# Patient Record
Sex: Female | Born: 1970 | Race: White | Hispanic: No | Marital: Married | Smoking: Never smoker
Health system: Southern US, Community
[De-identification: ages and names within clinical notes are randomized; demographics above are authoritative.]

## PROBLEM LIST (undated history)

## (undated) DIAGNOSIS — G43019 Migraine without aura, intractable, without status migrainosus: Principal | ICD-10-CM

## (undated) DIAGNOSIS — R51 Headache: Secondary | ICD-10-CM

## (undated) DIAGNOSIS — F32A Depression, unspecified: Secondary | ICD-10-CM

## (undated) DIAGNOSIS — E669 Obesity, unspecified: Secondary | ICD-10-CM

## (undated) DIAGNOSIS — F419 Anxiety disorder, unspecified: Secondary | ICD-10-CM

## (undated) DIAGNOSIS — F329 Major depressive disorder, single episode, unspecified: Secondary | ICD-10-CM

## (undated) DIAGNOSIS — I499 Cardiac arrhythmia, unspecified: Secondary | ICD-10-CM

## (undated) DIAGNOSIS — R519 Headache, unspecified: Secondary | ICD-10-CM

## (undated) HISTORY — PX: NASAL SEPTUM SURGERY: SHX37

## (undated) HISTORY — DX: Anxiety disorder, unspecified: F41.9

## (undated) HISTORY — DX: Depression, unspecified: F32.A

## (undated) HISTORY — DX: Obesity, unspecified: E66.9

## (undated) HISTORY — PX: CARPAL TUNNEL RELEASE: SHX101

## (undated) HISTORY — DX: Major depressive disorder, single episode, unspecified: F32.9

## (undated) HISTORY — PX: BACK SURGERY: SHX140

## (undated) HISTORY — DX: Migraine without aura, intractable, without status migrainosus: G43.019

## (undated) HISTORY — PX: TONSILLECTOMY: SUR1361

---

## 1998-05-13 ENCOUNTER — Ambulatory Visit (HOSPITAL_COMMUNITY): Admission: RE | Admit: 1998-05-13 | Discharge: 1998-05-13 | Payer: Self-pay | Admitting: Family Medicine

## 1998-05-13 ENCOUNTER — Encounter: Payer: Self-pay | Admitting: Family Medicine

## 1998-05-20 ENCOUNTER — Ambulatory Visit (HOSPITAL_COMMUNITY): Admission: RE | Admit: 1998-05-20 | Discharge: 1998-05-20 | Payer: Self-pay | Admitting: Neurosurgery

## 1998-05-20 ENCOUNTER — Encounter: Payer: Self-pay | Admitting: Neurosurgery

## 1998-06-03 ENCOUNTER — Encounter: Payer: Self-pay | Admitting: Neurosurgery

## 1998-06-03 ENCOUNTER — Ambulatory Visit (HOSPITAL_COMMUNITY): Admission: RE | Admit: 1998-06-03 | Discharge: 1998-06-03 | Payer: Self-pay | Admitting: Neurosurgery

## 1998-06-17 ENCOUNTER — Ambulatory Visit (HOSPITAL_COMMUNITY): Admission: RE | Admit: 1998-06-17 | Discharge: 1998-06-17 | Payer: Self-pay | Admitting: Neurosurgery

## 1998-06-17 ENCOUNTER — Encounter: Payer: Self-pay | Admitting: Neurosurgery

## 1998-06-26 ENCOUNTER — Encounter: Payer: Self-pay | Admitting: Neurosurgery

## 1998-06-26 ENCOUNTER — Ambulatory Visit (HOSPITAL_COMMUNITY): Admission: RE | Admit: 1998-06-26 | Discharge: 1998-06-26 | Payer: Self-pay | Admitting: Neurosurgery

## 1998-06-30 ENCOUNTER — Encounter: Payer: Self-pay | Admitting: Neurosurgery

## 1998-06-30 ENCOUNTER — Inpatient Hospital Stay (HOSPITAL_COMMUNITY): Admission: AD | Admit: 1998-06-30 | Discharge: 1998-07-03 | Payer: Self-pay | Admitting: Neurosurgery

## 1998-07-01 ENCOUNTER — Encounter: Payer: Self-pay | Admitting: Neurosurgery

## 1998-07-16 ENCOUNTER — Other Ambulatory Visit: Admission: RE | Admit: 1998-07-16 | Discharge: 1998-07-16 | Payer: Self-pay | Admitting: Gynecology

## 1999-04-20 ENCOUNTER — Other Ambulatory Visit: Admission: RE | Admit: 1999-04-20 | Discharge: 1999-04-20 | Payer: Self-pay | Admitting: Gynecology

## 1999-05-01 ENCOUNTER — Encounter (INDEPENDENT_AMBULATORY_CARE_PROVIDER_SITE_OTHER): Payer: Self-pay

## 1999-05-01 ENCOUNTER — Ambulatory Visit (HOSPITAL_COMMUNITY): Admission: RE | Admit: 1999-05-01 | Discharge: 1999-05-01 | Payer: Self-pay | Admitting: Gynecology

## 1999-05-10 ENCOUNTER — Encounter (INDEPENDENT_AMBULATORY_CARE_PROVIDER_SITE_OTHER): Payer: Self-pay | Admitting: Specialist

## 1999-05-10 ENCOUNTER — Other Ambulatory Visit: Admission: RE | Admit: 1999-05-10 | Discharge: 1999-05-10 | Payer: Self-pay | Admitting: Gynecology

## 2000-06-21 ENCOUNTER — Encounter: Admission: RE | Admit: 2000-06-21 | Discharge: 2000-09-19 | Payer: Self-pay | Admitting: Gynecology

## 2000-09-01 ENCOUNTER — Other Ambulatory Visit: Admission: RE | Admit: 2000-09-01 | Discharge: 2000-09-01 | Payer: Self-pay | Admitting: Gynecology

## 2000-11-27 ENCOUNTER — Inpatient Hospital Stay (HOSPITAL_COMMUNITY): Admission: AD | Admit: 2000-11-27 | Discharge: 2000-11-27 | Payer: Self-pay | Admitting: Gynecology

## 2001-03-20 ENCOUNTER — Inpatient Hospital Stay (HOSPITAL_COMMUNITY): Admission: AD | Admit: 2001-03-20 | Discharge: 2001-03-22 | Payer: Self-pay | Admitting: *Deleted

## 2001-05-02 ENCOUNTER — Other Ambulatory Visit: Admission: RE | Admit: 2001-05-02 | Discharge: 2001-05-02 | Payer: Self-pay | Admitting: Gynecology

## 2002-11-14 ENCOUNTER — Encounter: Payer: Self-pay | Admitting: Family Medicine

## 2002-11-14 ENCOUNTER — Ambulatory Visit (HOSPITAL_COMMUNITY): Admission: RE | Admit: 2002-11-14 | Discharge: 2002-11-14 | Payer: Self-pay | Admitting: Family Medicine

## 2002-12-02 ENCOUNTER — Ambulatory Visit (HOSPITAL_COMMUNITY): Admission: RE | Admit: 2002-12-02 | Discharge: 2002-12-03 | Payer: Self-pay | Admitting: Neurosurgery

## 2003-12-12 ENCOUNTER — Ambulatory Visit (HOSPITAL_BASED_OUTPATIENT_CLINIC_OR_DEPARTMENT_OTHER): Admission: RE | Admit: 2003-12-12 | Discharge: 2003-12-12 | Payer: Self-pay | Admitting: Otolaryngology

## 2004-11-19 ENCOUNTER — Other Ambulatory Visit: Admission: RE | Admit: 2004-11-19 | Discharge: 2004-11-19 | Payer: Self-pay | Admitting: Gynecology

## 2005-07-04 ENCOUNTER — Encounter: Admission: RE | Admit: 2005-07-04 | Discharge: 2005-07-04 | Payer: Self-pay | Admitting: Neurosurgery

## 2005-08-09 ENCOUNTER — Encounter: Admission: RE | Admit: 2005-08-09 | Discharge: 2005-08-09 | Payer: Self-pay | Admitting: Neurosurgery

## 2005-09-09 ENCOUNTER — Encounter: Admission: RE | Admit: 2005-09-09 | Discharge: 2005-09-09 | Payer: Self-pay | Admitting: Neurosurgery

## 2005-11-23 ENCOUNTER — Inpatient Hospital Stay (HOSPITAL_COMMUNITY): Admission: RE | Admit: 2005-11-23 | Discharge: 2005-11-28 | Payer: Self-pay | Admitting: Neurosurgery

## 2007-03-21 HISTORY — PX: KNEE ARTHROSCOPY: SHX127

## 2007-03-25 ENCOUNTER — Encounter: Admission: RE | Admit: 2007-03-25 | Discharge: 2007-03-25 | Payer: Self-pay | Admitting: Neurosurgery

## 2007-10-25 ENCOUNTER — Other Ambulatory Visit: Admission: RE | Admit: 2007-10-25 | Discharge: 2007-10-25 | Payer: Self-pay | Admitting: Gynecology

## 2008-02-08 ENCOUNTER — Emergency Department (HOSPITAL_COMMUNITY): Admission: EM | Admit: 2008-02-08 | Discharge: 2008-02-08 | Payer: Self-pay | Admitting: Emergency Medicine

## 2008-02-14 ENCOUNTER — Encounter: Admission: RE | Admit: 2008-02-14 | Discharge: 2008-02-14 | Payer: Self-pay | Admitting: Geriatric Medicine

## 2008-03-05 ENCOUNTER — Ambulatory Visit (HOSPITAL_COMMUNITY): Admission: RE | Admit: 2008-03-05 | Discharge: 2008-03-05 | Payer: Self-pay | Admitting: General Surgery

## 2008-04-01 ENCOUNTER — Encounter: Admission: RE | Admit: 2008-04-01 | Discharge: 2008-04-01 | Payer: Self-pay | Admitting: General Surgery

## 2008-05-12 ENCOUNTER — Encounter: Admission: RE | Admit: 2008-05-12 | Discharge: 2008-08-10 | Payer: Self-pay | Admitting: General Surgery

## 2008-05-27 ENCOUNTER — Ambulatory Visit (HOSPITAL_COMMUNITY): Admission: RE | Admit: 2008-05-27 | Discharge: 2008-05-27 | Payer: Self-pay | Admitting: General Surgery

## 2008-05-27 HISTORY — PX: LAPAROSCOPIC GASTRIC BANDING: SHX1100

## 2008-06-11 ENCOUNTER — Ambulatory Visit: Payer: Self-pay | Admitting: Gynecology

## 2008-06-30 ENCOUNTER — Ambulatory Visit: Payer: Self-pay | Admitting: Gynecology

## 2008-08-26 ENCOUNTER — Ambulatory Visit: Payer: Self-pay | Admitting: Gynecology

## 2008-11-18 ENCOUNTER — Ambulatory Visit: Payer: Self-pay | Admitting: Gynecology

## 2008-11-18 HISTORY — PX: VAGINAL HYSTERECTOMY: SUR661

## 2008-11-26 ENCOUNTER — Ambulatory Visit (HOSPITAL_COMMUNITY): Admission: RE | Admit: 2008-11-26 | Discharge: 2008-11-27 | Payer: Self-pay | Admitting: Gynecology

## 2008-11-26 ENCOUNTER — Encounter: Payer: Self-pay | Admitting: Gynecology

## 2008-11-26 ENCOUNTER — Ambulatory Visit: Payer: Self-pay | Admitting: Gynecology

## 2008-12-05 ENCOUNTER — Ambulatory Visit: Payer: Self-pay | Admitting: Gynecology

## 2008-12-19 ENCOUNTER — Ambulatory Visit: Payer: Self-pay | Admitting: Gynecology

## 2010-01-15 ENCOUNTER — Other Ambulatory Visit: Admission: RE | Admit: 2010-01-15 | Discharge: 2010-01-15 | Payer: Self-pay | Admitting: Gynecology

## 2010-01-15 ENCOUNTER — Ambulatory Visit: Payer: Self-pay | Admitting: Gynecology

## 2010-03-26 IMAGING — CT CT HEAD W/O CM
1 of 2 series · 13 of 30 positions shown, 17 images · non-contrast
Comparison: None

CLINICAL DATA: Hypertensive episode, visual changes, dizziness

CT HEAD WITHOUT CONTRAST
TECHNIQUE: Contiguous axial images were obtained from the base of
the skull through the vertex without contrast.

[Series 2: brain · axial · 0.47mm/px · z∈[+157,+281]mm · 13 of 28 slices shown, 17 images]
[im 2/28  brain]
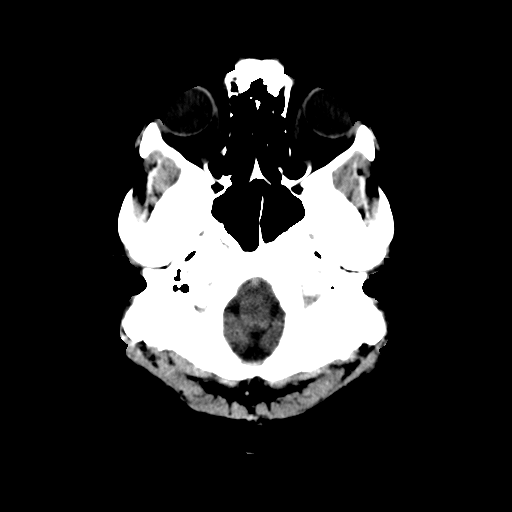
[im 2/28  bone]
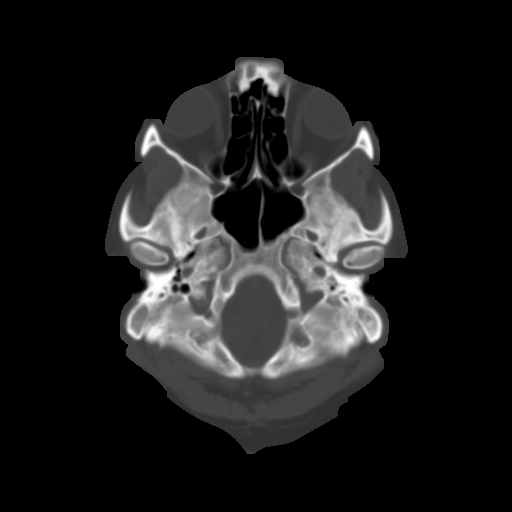
[im 4/28  brain]
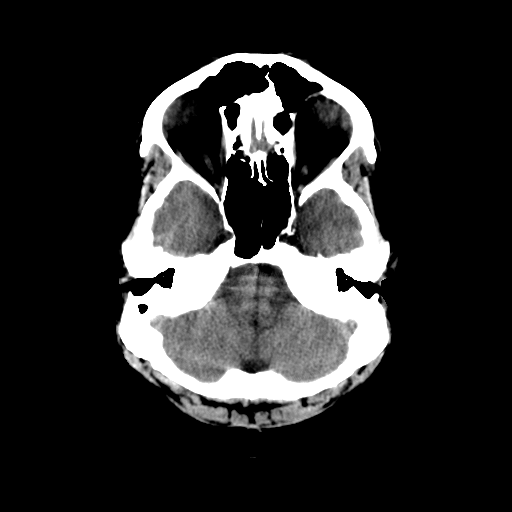
[im 6/28  brain]
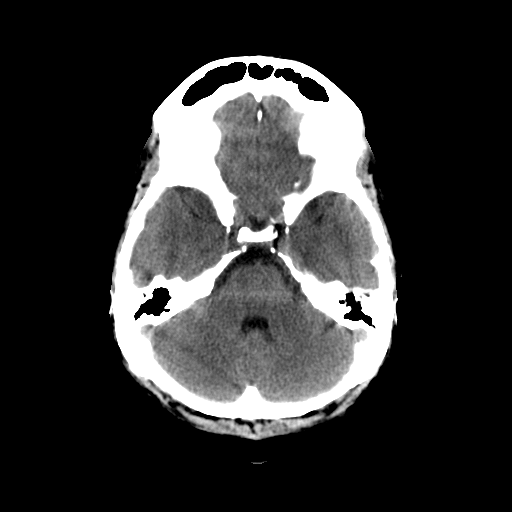
[im 8/28  brain]
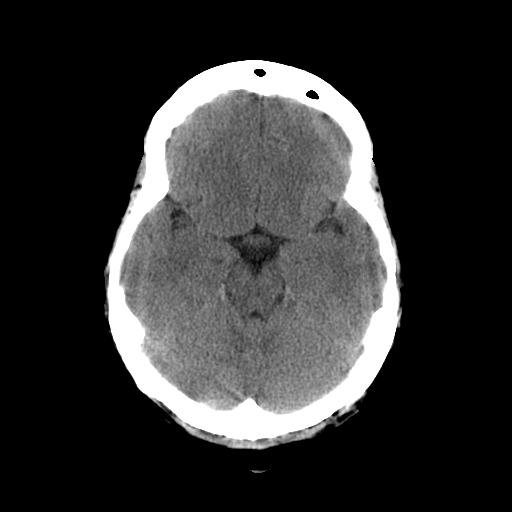
[im 10/28  brain]
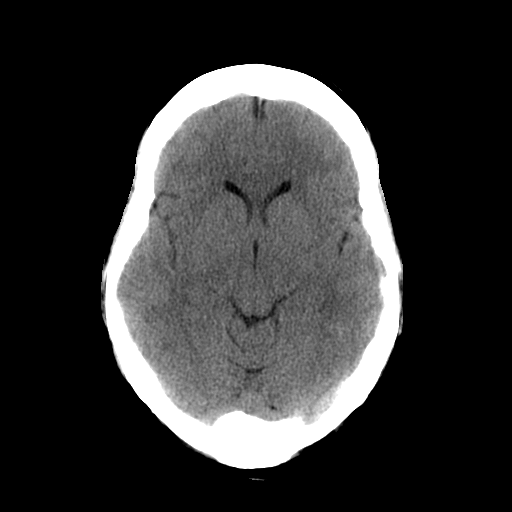
[im 10/28  bone]
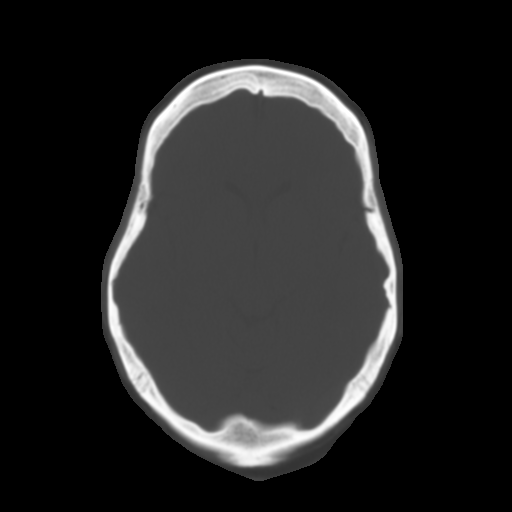
[im 12/28  brain]
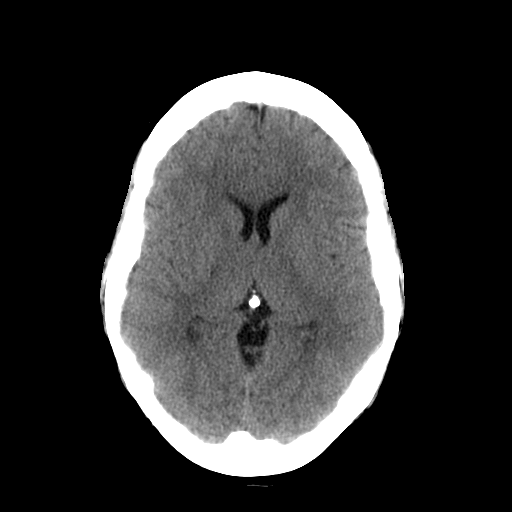
[im 14/28  brain]
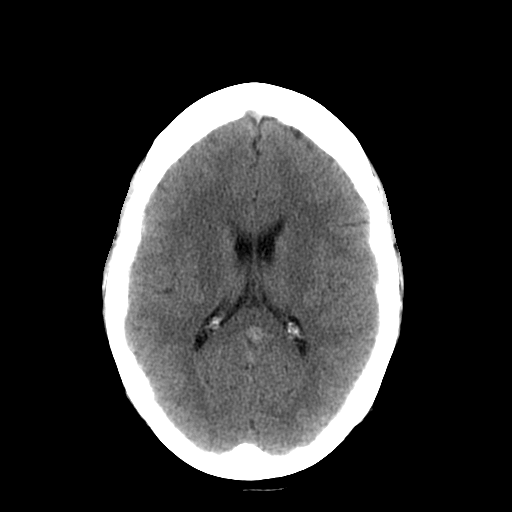
[im 16/28  brain]
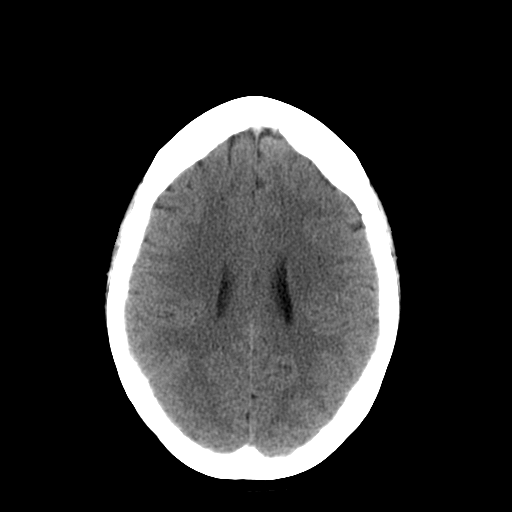
[im 18/28  brain]
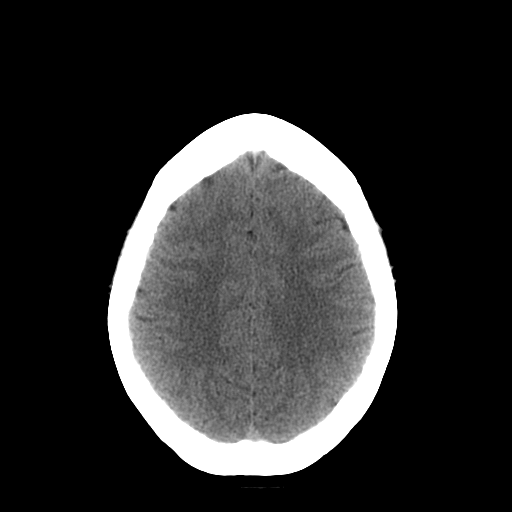
[im 18/28  bone]
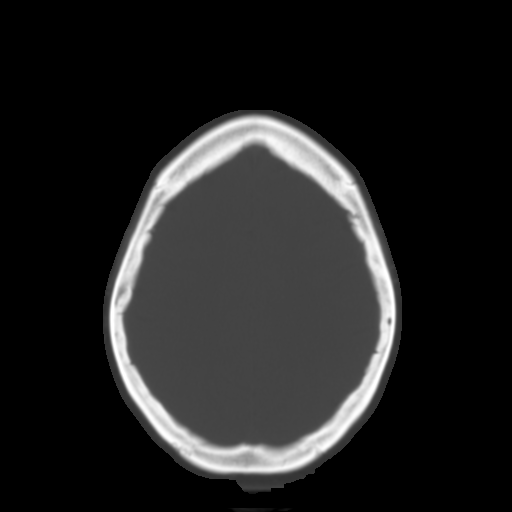
[im 20/28  brain]
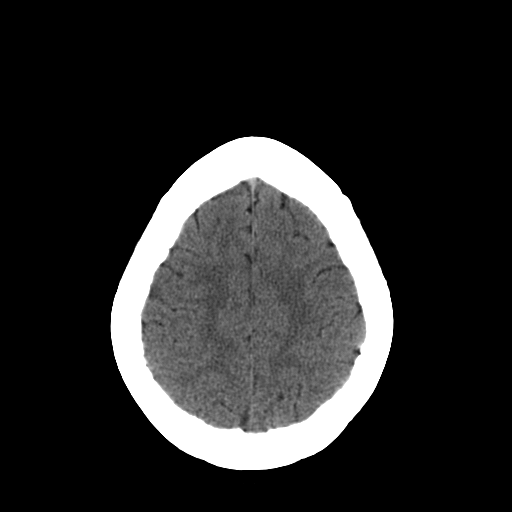
[im 22/28  brain]
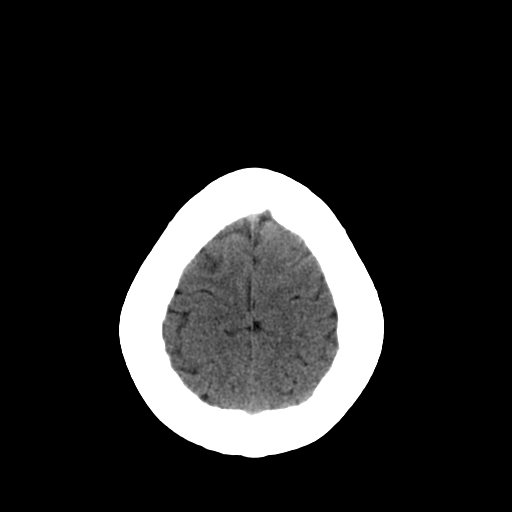
[im 24/28  brain]
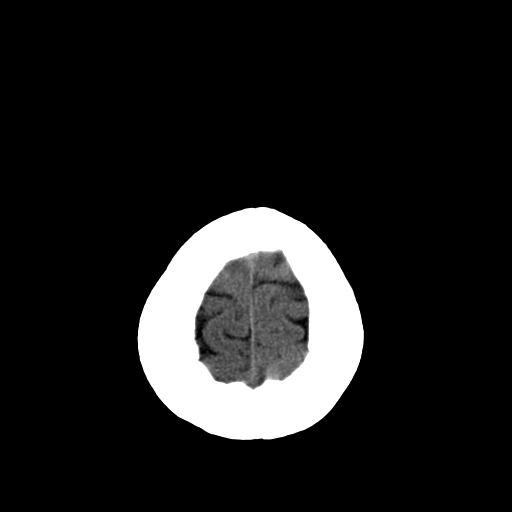
[im 26/28  brain]
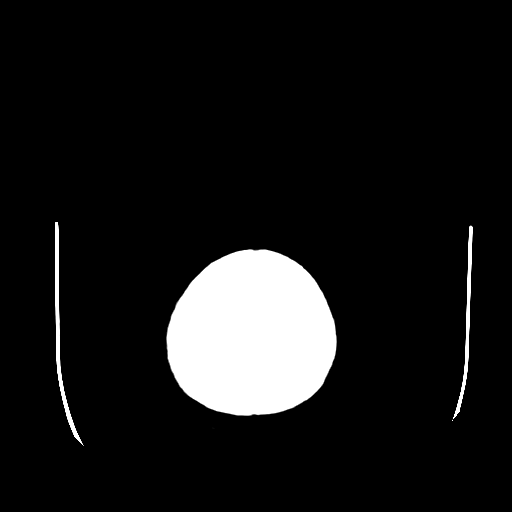
[im 26/28  bone]
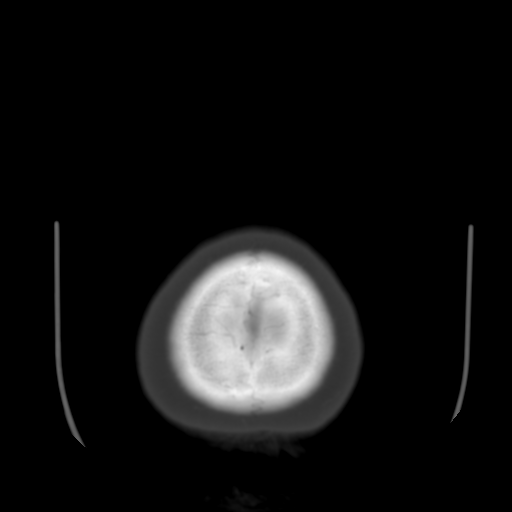

[13 of 30 positions shown; findings below may reference images not displayed]

FINDINGS: Normal ventricular morphology.
No midline shift or mass effect.
Normal appearance of brain parenchyma.
No intracranial hemorrhage, mass lesion, or acute infarct.
Visualized paranasal sinuses and mastoid air cells clear.
Bones unremarkable.
IMPRESSION: No acute intracranial abnormalities.

## 2010-07-11 ENCOUNTER — Encounter: Payer: Self-pay | Admitting: General Surgery

## 2010-08-02 ENCOUNTER — Ambulatory Visit (INDEPENDENT_AMBULATORY_CARE_PROVIDER_SITE_OTHER): Payer: BC Managed Care – PPO | Admitting: Gynecology

## 2010-08-02 DIAGNOSIS — R1031 Right lower quadrant pain: Secondary | ICD-10-CM

## 2010-08-04 ENCOUNTER — Other Ambulatory Visit: Payer: BC Managed Care – PPO

## 2010-08-04 ENCOUNTER — Ambulatory Visit: Payer: BC Managed Care – PPO | Admitting: Gynecology

## 2010-08-06 ENCOUNTER — Ambulatory Visit (INDEPENDENT_AMBULATORY_CARE_PROVIDER_SITE_OTHER): Payer: BC Managed Care – PPO | Admitting: Gynecology

## 2010-08-06 ENCOUNTER — Other Ambulatory Visit: Payer: BC Managed Care – PPO

## 2010-08-06 DIAGNOSIS — R1031 Right lower quadrant pain: Secondary | ICD-10-CM

## 2010-08-06 DIAGNOSIS — N83209 Unspecified ovarian cyst, unspecified side: Secondary | ICD-10-CM

## 2010-09-22 ENCOUNTER — Other Ambulatory Visit: Payer: BC Managed Care – PPO

## 2010-09-22 ENCOUNTER — Ambulatory Visit (INDEPENDENT_AMBULATORY_CARE_PROVIDER_SITE_OTHER): Payer: BC Managed Care – PPO | Admitting: Gynecology

## 2010-09-22 DIAGNOSIS — D3910 Neoplasm of uncertain behavior of unspecified ovary: Secondary | ICD-10-CM

## 2010-09-22 DIAGNOSIS — N949 Unspecified condition associated with female genital organs and menstrual cycle: Secondary | ICD-10-CM

## 2010-09-22 DIAGNOSIS — N831 Corpus luteum cyst of ovary, unspecified side: Secondary | ICD-10-CM

## 2010-09-27 LAB — CBC
HCT: 31.9 % — ABNORMAL LOW (ref 36.0–46.0)
HCT: 34 % — ABNORMAL LOW (ref 36.0–46.0)
Hemoglobin: 10.9 g/dL — ABNORMAL LOW (ref 12.0–15.0)
Hemoglobin: 11.5 g/dL — ABNORMAL LOW (ref 12.0–15.0)
MCHC: 33.9 g/dL (ref 30.0–36.0)
MCHC: 34.1 g/dL (ref 30.0–36.0)
MCV: 84.7 fL (ref 78.0–100.0)
MCV: 85.3 fL (ref 78.0–100.0)
Platelets: 294 10*3/uL (ref 150–400)
Platelets: 297 10*3/uL (ref 150–400)
RBC: 3.74 MIL/uL — ABNORMAL LOW (ref 3.87–5.11)
RBC: 4.02 MIL/uL (ref 3.87–5.11)
RDW: 14.4 % (ref 11.5–15.5)
RDW: 14.9 % (ref 11.5–15.5)
WBC: 11.9 10*3/uL — ABNORMAL HIGH (ref 4.0–10.5)
WBC: 6.5 10*3/uL (ref 4.0–10.5)

## 2010-09-27 LAB — HCG, SERUM, QUALITATIVE: Preg, Serum: NEGATIVE

## 2010-09-29 ENCOUNTER — Institutional Professional Consult (permissible substitution) (INDEPENDENT_AMBULATORY_CARE_PROVIDER_SITE_OTHER): Payer: BC Managed Care – PPO | Admitting: Gynecology

## 2010-09-29 DIAGNOSIS — N83209 Unspecified ovarian cyst, unspecified side: Secondary | ICD-10-CM

## 2010-11-02 NOTE — Discharge Summary (Signed)
NAMEPAULYNE, MOOTY             ACCOUNT NO.:  [id number]   MEDICAL RECORD NO.:  [id number]          PATIENT TYPE:  OIB   LOCATION:  9318                          FACILITY:  [facility]   PHYSICIAN:  Timothy P. Fontaine, M.D.DATE OF BIRTH:  September 16, 1970   DATE OF ADMISSION:  11/26/2008  DATE OF DISCHARGE:  11/27/2008                               DISCHARGE SUMMARY   DISCHARGE DIAGNOSES:  1. Leiomyoma.  2. Menorrhagia.  3. Dysmenorrhea.   PROCEDURE:  Laparoscopic-assisted vaginal hysterectomy, November 26, 2008.   PATHOLOGY:  Pending.   HOSPITAL COURSE:  A 40 year old underwent uncomplicated LAVH, November 26, 2008.  Postoperative course was uncomplicated.  She was discharged on  postoperative day #1, ambulating well, tolerating a regular diet with a  postoperative hemoglobin of 10.  The patient received precautions,  instructions, and followup, will be seen in the office in 1 week  following discharge and was given a prescription for Tylox, #25 1-2 p.o.  q.4-6 h. p.r.n. pain.      Timothy P. Fontaine, M.D.  Electronically Signed     TPF/MEDQ  D:  11/27/2008  T:  11/27/2008  Job:  [id number]

## 2010-11-02 NOTE — Op Note (Signed)
NAMENATACIA, CHAISSON             ACCOUNT NO.:  [id number]   MEDICAL RECORD NO.:  [id number]          PATIENT TYPE:  AMB   LOCATION:  DAY                          FACILITY:  [facility]   PHYSICIAN:  Sharlet Salina T. Hoxworth, M.D.DATE OF BIRTH:  [birth year]   DATE OF PROCEDURE:  05/27/2008  DATE OF DISCHARGE:                               OPERATIVE REPORT   PREOPERATIVE DIAGNOSIS:  Morbid obesity.   POSTOPERATIVE DIAGNOSIS:  Morbid obesity.   SURGICAL PROCEDURE:  Placement laparoscopic adjustable gastric band.   SURGEON:  Lorne Skeens. Hoxworth, M.D.   ASSISTANT:  Alfonse Ras, MD.   ANESTHESIA:  General.   BRIEF HISTORY:  Ms. Meroney is a 40 year old female with progressive  morbid obesity unresponsive to multiple attempts at medical management  who presents with a weight of 309 pounds, BMI of 46.3 and no  comorbidities.  After extensive discussion preoperatively detailed  elsewhere, we have elected proceed with placement laparoscopic  adjustable gastric band.  She is now brought to the operating room for  this procedure.   DESCRIPTION OF PROCEDURE:  The patient was brought to the operating room  and placed in the supine position on the operating room table and  general endotracheal anesthesia was induced.  PAS were in place.  She  had been given preoperative IV antibiotics and subcutaneous heparin.  The abdomen was widely sterilely prepped and draped.  The correct  patient and patient procedure were verified.  Local anesthesia was used  to infiltrate the trocar sites.  Access was obtained in the left  subcostal space with 11 mm OptiView trocar without difficulty and  pneumoperitoneum established.  There was no evidence of trocar injury.  Under direct vision a 15 mm trocar was placed bilaterally in the right  upper quadrant, an 11 mm trocar in the right subcostal space  midclavicular line, another 11 mm trocar above the left of the umbilicus  for camera port.  The patient was  placed in steep reverse Trendelenburg  and a Nathanson retractor was placed subxiphoid and the left lobe of the  liver elevated with excellent exposure of the hiatus and upper stomach.  The angle of His was exposed and the peritoneum incised here and blunt  dissection carried down along the left crus toward the retrogastric  space.  Following this, the pars flaccida was opened in the avascular  area and the crossing fat at the base of the right crus was identified.  The peritoneum was incised and the finger dissector easily passed into  this space retrogastric and deployed up to the previously dissected area  at the angle of His.  A flushed AP standard band system was then  introduced.  The tubing placed in the finger retractor and brought back  behind the stomach.  With the sizing tube in place, after pulling the  band behind the stomach it was locked into place and there was no undue  tension.  The sizing tube was removed, holding the tubing toward the  patient's feet.  The fundus was imbricated up over the band of the small  gastric pouch with three interrupted  2-0 Ethibond sutures.  In addition,  a gastro-gastric suture was placed just inferior to the band.  The band  appeared to be in excellent position.  There was no bleeding, evidence  of trocar injury or other abnormalities.  The tubing was brought out  through the right upper quadrant trocar site and the [facility] retractor  removed.  All CO2 evacuated.  Trocars removed.  This incision was  lengthened slightly and the anterior fascia exposed.  Four 2-0 Prolene  stay sutures were placed.  The tubing was cut and attached the port  which was then affixed to the anterior fascia with the previously placed  sutures.  The soft tissue was infiltrated with Marcaine.  The tubing was  seen to  curve smoothly into the abdomen.  This incision was irrigated.  The  subcutaneous was closed at the port site with running 2-0 Vicryl and all  skin  incisions were closed with subcuticular Monocryl and Dermabond.  Sponge, needle and instrument counts correct.  The patient was taken to  recovery in good condition.      Lorne Skeens. Hoxworth, M.D.  Electronically Signed     BTH/MEDQ  D:  05/27/2008  T:  05/27/2008  Job:  [id number]

## 2010-11-02 NOTE — Op Note (Signed)
[name], [name]             ACCOUNT NO.:  [id number]   MEDICAL RECORD NO.:  [id number]          PATIENT TYPE:  AMB   LOCATION:  SDC                           FACILITY:  [facility]   PHYSICIAN:  Timothy P. Fontaine, M.D.DATE OF BIRTH:  07-12-1970   DATE OF PROCEDURE:  11/26/2008  DATE OF DISCHARGE:                               OPERATIVE REPORT   PREOPERATIVE DIAGNOSES:  Leiomyoma, dysmenorrhea, menorrhagia.   POSTOPERATIVE DIAGNOSES:  Leiomyoma, dysmenorrhea, menorrhagia with  additional peritoneal cyst.   PROCEDURE:  Laparoscopic-assisted vaginal hysterectomy and excision of  peritoneal cyst.   SURGEON:  Timothy P. Fontaine, MD   ASSISTANT:  Rande Brunt. Eda Paschal, MD   ANESTHETIC:  General.   COMPLICATIONS:  None.   ESTIMATED BLOOD LOSS:  275 mL.   SPECIMEN:  1. Uterus.  2. Peritoneal cyst.   FINDINGS:  EUA, external BUS, and vagina normal.  Cervix normal.  Uterus, midline mobile, globoid, enlarged.  Adnexa without masses.  Uterus symmetrically enlarged consistent with history of myoma.  Anterior cul-de-sac and posterior cul-de-sac normal.  Right and left  fallopian tubes normal length, caliber, fimbriated ends.  Right and left  ovaries grossly normal, free, and mobile.  A small peritoneal cyst right  lateral posterior cul-de-sac, questionable infarcted epiploic versus  possibly small dermoid excised and sent to Pathology.  Upper abdominal  exam was grossly normal noting appendix free and mobile.  Liver smooth.  Gallbladder not visualized.  Lap-Band catheter noted in the upper  abdomen, left undisturbed.   PROCEDURE:  The patient was taken to the operating room, underwent  general anesthesia, was placed in low dorsal lithotomy position,  received an abdominal perineal vaginal preparation with Betadine  solution.  EUA performed.  Hulka tenaculum placed on the cervix, and the  bladder emptied with an indwelling Foley catheterization, placed in  sterile technique.  The  patient was draped in the usual fashion.  A  transverse infraumbilical incision was made, and using the 10-mm OptiVu  trocar the abdomen was directly entered under direct visualization  without difficulty and subsequently insufflated.  Right and left 5-mm  suprapubic ports were then placed under direct visualization after  transillumination of the vessels without difficulty.  Examination of the  pelvic organs and upper abdominal exam was carried out with findings  noted above.  The uterus was elevated from the pelvis, and using the  LigaSure cautery device, the left utero-ovarian pedicle was identified,  grasped, cauterized, and subsequently incised.  The broad ligament  peritoneal reflections were likewise incised, and the left round  ligament was cauterized and incised.  The left uterine vessels were  identified, again bipolar cauterized with the LigaSure device with 2  separate cauterizations and then incised.  The anterior vesicouterine  peritoneal fold was then sharply incised along the anterior surface.  A  similar procedure was then carried out on the other side meeting the  peritoneal reflection in the midline.  Attention was then turned to the  vaginal portion of the case, and the patient was placed in the high  dorsal lithotomy position.  Cervix visualized with a weighted speculum.  The Hulka tenaculum removed.  The cervix was regrasped with a tenaculum,  and the cervical mucosa was circumferentially injected using lidocaine  with epinephrine mixture, approximately 8 mL total.  The cervical mucosa  was then circumferentially and sharply incised, and through sharp  dissection the paracervical planes developed.  The posterior cul-de-sac  was then sharply entered without difficulty, and a long weighted  speculum was placed.  The right and left uterosacral ligaments were  identified, clamped, cut, and ligated using 0 Vicryl suture and tagged  for future reference.  The anterior  cul-de-sac was then sharply and  bluntly developed and ultimately entered, and the uterus was  progressively freed from its attachments through clamping, cutting, and  ligating of the cardinal ligaments and parametrial tissues using 0  Vicryl suture.  Due to the bulk of the uterus, it became necessary to  core initially the cervix and then ultimately to morcellate the  remaining uterus to allow the clots upon itself to be able to be removed  vaginally.  The uterus was ultimately removed, and all remaining  attachments were clamped, cut, and ligated using 0 Vicryl suture.  The  long weighted speculum was replaced for the shorter weighted speculum.  The posterior mucosa of the vagina was grasped with an Allis clamp, and  the posterior vaginal cuff was closed using a 0 Vicryl suture in a  running interlocking stitch from uterosacral ligament to uterosacral  ligament.  The tail sponge that was placed was removed, and the vagina  was closed anterior to posterior using 0 Vicryl suture in interrupted  figure-of-eight stitch.  Prior to this, the cul-de-sac was irrigated and  showed adequate hemostasis, and subsequently the vagina was irrigated  and again showed adequate hemostasis.  The weighted speculum was  removed.  The patient placed in low dorsal lithotomy position, the  abdomen reinsufflated, and reinspection of all pedicle sites showed  adequate hemostasis.  There are several small bleeders along the bladder  flap, and these were bipolar cauterized superficially without  difficulty.  She had the peritoneal cyst noted in the right cul-de-sac  and overtly looked like an infarcted epiploica, possibly a small  dermoid, and this was elevated, and the peritoneal adhesions were  sharply lysed to free of the cyst and it was removed and sent to  Pathology.  The gas was slowly allowed to escape.  Adequate hemostasis  visualized at the cul-de-sac and at the pedicle sites.  The 5-mm ports  were then  removed.  Hemostasis visualized at the port sites.  The  infraumbilical port was then backed out under direct visualization  showing adequate hemostasis and no evidence of hernia formation.  A 0  Vicryl interrupted subcutaneous fascial stitch was placed  infraumbilically, and all skin incisions closed using Dermabond skin  adhesive.  The patient was placed in supine position, awakened without  difficulty, and taken to recovery room in good condition having  tolerated the procedure well.      Timothy P. Fontaine, M.D.  Electronically Signed     TPF/MEDQ  D:  11/26/2008  T:  11/27/2008  Job:  [id number]

## 2010-11-02 NOTE — H&P (Signed)
NAMESHIRLEYANN, [name]             ACCOUNT NO.:  [id number]   MEDICAL RECORD NO.:  [id number]          PATIENT TYPE:  AMB   LOCATION:  SDC                           FACILITY:  [facility]   PHYSICIAN:  Timothy P. Fontaine, M.D.DATE OF BIRTH:  01/11/1971   DATE OF ADMISSION:  DATE OF DISCHARGE:                              HISTORY & PHYSICAL   [name] is being admitted to [facility] hospital November 26, 2008 at 8:30.   CHIEF COMPLAINT:  Menorrhagia, dysmenorrhea, and leiomyoma.   HISTORY OF PRESENT ILLNESS:  A 40 year old G11, P45, AB1  female,  vasectomy birth control with worsening menorrhagia, irregular bleeding,  dysmenorrhea, initially had ultrasound, which showed degenerating myoma  measuring 78 mm was followed expectantly.  Her pain and bleeding  continued and she is admitted for hysterectomy.  She had a  sonohystogram, which showed deviation of the cavity from the myoma, but  no intracavitary abnormalities, an endometrial biopsy showing benign  endocervical glands, but no endometrial tissue although appeared to be  clearly within the cavity and a thin endometrium was noted on the  sonohystogram.  Options for management were reviewed with her as noted  in the assessment and plan and she elects for hysterectomy.  She is  admitted for an LAVH.   PAST MEDICAL HISTORY:  Uncomplicated.   PAST SURGICAL HISTORY:  Includes a lap band surgery and lower disk  lumbar surgery with no lower spine restrictions on movement.   CURRENT MEDICATIONS:  No prescription medications.   ALLERGIES:  No medications.   REVIEW OF SYSTEMS:  Noncontributory.   FAMILY HISTORY:  Noncontributory.   SOCIAL HISTORY:  Noncontributory.   ADMISSION PHYSICAL EXAMINATION:  VITAL SIGNS:  Afebrile, vital signs are  stable.  HEENT: Normal.  LUNGS:  Clear.  CARDIAC:  Regular rate.  No rubs, murmurs, or gallops.  ABDOMEN:  Benign.  PELVIC:  External BUS, vagina normal.  Cervix normal.  Uterus globoid,  midline moveable.   Adnexa without masses or tenderness.   ASSESSMENT:  A 40 year old gravida 2, para 1, abortion 1 female  vasectomy birth control with worsening menorrhagia, dysmenorrhea, and  pelvic pain.  Ultrasound showing a degenerating fibroid, approximately 7  cm for laparoscopic-assisted vaginal hysterectomy.  The options for  management were reviewed with her to include observation, hormonal  manipulation such as low-dose oral contraceptives, attempted use of  Mirena IUD, progesterone only, Depo-Lupron suppression, uterine artery  embolization, myomectomy either open or robotic.  The patient ultimately  wants to proceed with hysterectomy.  I discussed various approaches and  to include TVH versus LAVH versus TAH and we agree on LAVH to free up  the uterus and allow for vaginal removal with hysterectomy was discussed  and long-term issues reviewed to include.  The patient understands it is  absolute irreversible sterility.  Sexuality following hysterectomy  discussed and the potential for persistent dyspareunia as well as  orgasmic dysfunction was all discussed, understood and accepted.  The  ovarian conservation issue was reviewed with her, options for keeping  both ovaries for continued hormone production, but the risk of ovarian  disease in the future  both benign and malignant and risk of ovarian  cancer was discussed versus removing her ovaries and the issues of  hypoestrogenism with acceleration of cardiovascular disease,  osteoporosis, symptoms the possibility for ERT and the risks of ERT to  include breast cancer, stroke, heart attack, DVT, WHI study was all  discussed, understood and accepted.  She wants to keep both ovaries,  accepting the risk of ovarian cancer and benign ovarian disease in the  future, but she does give me permission to remove one or both ovaries if  significant disease is encountered at the time of surgery.  I discussed  that technically it is involved with the surgery  to include use of the  laparoscopic multiple port sites, insufflation, use of technologies such  as electrocautery, Harmonic scalpel and the vaginal approach.  She also  understands that at any time during the procedure I may convert her LAVH  to a TAH if complications arise or if it is felt unsafe to proceed with  LAVH approach.  The risks of the surgery to include infection, requiring  prolonged antibiotics as well as abscess, hematoma formation requiring  reoperation and drainage was reviewed.  The risk of incisions,  complications to include opening and draining of incisions, closure by  secondary intention, longer incision such as a TAH incision, issues of  cosmetics to include keloid formation as well as long-term issues of  hernia formation was all discussed, understood, and accepted.  The risk  of bleeding leading to hemorrhage and transfusion and risks of  transfusion reviewed to include transfusion reaction, hepatitis, HIV,  mad cow disease and other unknown entities.  The risk of inadvertent  injury to internal organs including bowel, bladder, ureters, vessels and  nerves necessitating major exploratory reparative surgeries, future  reparative surgeries including bowel resection, bowel repair, ostomy  formation, bladder repair, ureteral reimplantation or damage repair was  all discussed, understood and accepted.  The patient's questions were  answered to her satisfaction.  She is ready to proceed with surgery.      Timothy P. Fontaine, M.D.  Electronically Signed     TPF/MEDQ  D:  11/18/2008  T:  11/19/2008  Job:  [id number]

## 2010-11-05 NOTE — Op Note (Signed)
[name], [name]             ACCOUNT NO.:  [id number]   MEDICAL RECORD NO.:  [id number]          PATIENT TYPE:  INP   LOCATION:  3007                         FACILITY:  [facility]   PHYSICIAN:  Hilda Lias, M.D.   DATE OF BIRTH:  [birth year]   DATE OF PROCEDURE:  11/23/2005  DATE OF DISCHARGE:                                 OPERATIVE REPORT   PREOPERATIVE DIAGNOSIS:  Degenerative disc disease, with recurrent herniated  disc L5-S1 x3, with a chronic S1 radiculopathy.   POSTOPERATIVE DIAGNOSIS:  Degenerative disc disease, with recurrent  herniated disc L5-S1 x3, with a chronic S1 radiculopathy.   PROCEDURE:  1.  Bilateral L5-S1 diskectomy, interbody fusion with a cage and BMP,      posterolateral arthrodesis L5-S1, pedicle screws L5-S1.  2.  Cell Saver.  3.  C-arm.   SURGEON:  Hilda Lias, M.D.   ASSISTANT:  Stefani Dama, M.D.   CLINICAL HISTORY:  The patient was admitted because of back pain with  radiation to the left leg.  The patient previously had two lumbar  diskectomies.  Now, she has degenerative changes.  Surgery was advised, and  the risks were explained in the history and physical.   PROCEDURE:  The patient was taken to the operating room, and she was  positioned in a prone manner.  The back was prepped with DuraPrep.  A  midline incision resecting the previous scar was made through the skin and  through at least 4 inches of fat tissue.  We identified the L5-S1 space, and  muscles were retracted laterally.  We did an x-ray with the C-arm to  localize the area.  From then on, we removed the spinous process and lamina  and facet of L5.  On the right side, we were able to retract the nerve root  without any problem, and diskectomy was accomplished.  On the left side, the  patient had quite a bit of scar tissue.  Lysis was accomplished to  decompress the L5 and S1 nerve root.  We entered the disc space.  Also,  interbody fusion was achieved.  Then, using two  cages of 22 x 10, BMP and  autograft were inserted in the disc space.  The lateral aspect of the disc  was also fused using BMP and autograft.  From then on, we started using the  C-arm.  Because of her obesity, it was quite difficult to see the bone  structure.  Nevertheless, using AP and lateral, we were able to localize the  pedicle of L5 and S1.  Pedicle probes were inserted, and four screws, two of  them 6.5 x 40 at the level of L5, and 6.5 x 45 at the level of S1 were  inserted.  With the nerve hook, we investigated, and there was no evidence  of any penetration of the medial aspect of the pedicle.  Nevertheless, the  images were careful in the AP and in the lateral view.  Then, after we had a  good fusion, the pedicle screws were connected with a rod and caps.  Then,  we went laterally.  We removed the periosteum of L5 and  the ala of the sacrum, and a mix of BMP and autograft was used for  arthrodesis.  From then on, the area was irrigated.  Valsalva maneuver was  negative.  Fentanyl was left in the pleural space, and the wound was closed  with Vicryl and Steri-Strips.           ______________________________  Hilda Lias, M.D.     EB/MEDQ  D:  11/23/2005  T:  11/23/2005  Job:  [id number]

## 2010-11-05 NOTE — Discharge Summary (Signed)
[name], [name]             ACCOUNT NO.:  [id number]   MEDICAL RECORD NO.:  [id number]          PATIENT TYPE:  INP   LOCATION:  3007                         FACILITY:  [facility]   PHYSICIAN:  Hilda Lias, M.D.   DATE OF BIRTH:  1971/04/02   DATE OF ADMISSION:  11/23/2005  DATE OF DISCHARGE:  11/28/2005                                 DISCHARGE SUMMARY   ADMISSION DIAGNOSIS:  Degenerative disc disease with recurrent herniated  disc L5-S1.   FINAL DIAGNOSIS:  Degenerative disc disease with recurrent herniated disc L5-  S1.   CLINICAL HISTORY:  The patient was admitted because of back pain with  radiation to both legs.  Patient previously has two lumbar discectomies.  The x-ray showed degenerative disc disease at the level of 5-1.  Fusion was  advised.   LABORATORY DATA:  Normal.   HOSPITAL COURSE:  The patient was taken to surgery and total L5-S1  discectomy followed by fusion using the pedicle screws was done.  Today she  is feeling much better.  She is ambulating.  She wants to go  home.   CONDITION ON DISCHARGE:  Improvement.   MEDICATIONS:  Percocet, Flexeril and Neurontin.   DIET:  She was encouraged to lose weight.   ACTIVITY:  Not to drive for at least a week.  No bending.   FOLLOW UP:  To be seen by me in five weeks.           ______________________________  Hilda Lias, M.D.     EB/MEDQ  D:  11/28/2005  T:  11/28/2005  Job:  [id number]

## 2010-11-05 NOTE — Op Note (Signed)
NAME:  [name], [name]                       ACCOUNT NO.:  [id number]   MEDICAL RECORD NO.:  [id number]                   PATIENT TYPE:  AMB   LOCATION:  DSC                                  FACILITY:  [facility]   PHYSICIAN:  Kinnie Scales. Annalee Genta, M.D.            DATE OF BIRTH:  [birth year]   DATE OF PROCEDURE:  12/12/2003  DATE OF DISCHARGE:                                 OPERATIVE REPORT   PREOPERATIVE DIAGNOSES AND INDICATION FOR SURGERY:  1. Deviated nasal septum.  2. Bilateral inferior turbinate hypertrophy.   POSTOPERATIVE DIAGNOSES AND INDICATION FOR SURGERY:  1. Deviated nasal septum.  2. Bilateral inferior turbinate hypertrophy.   SURGICAL PROCEDURE:  1. Nasal septoplasty.  2. Bilateral inferior turbinate intramural cautery.   ANESTHESIA:  General endotracheal.   SURGEON:  Dr. Annalee Genta   COMPLICATIONS:  None.  The patient was transferred from the operating room  to the recovery room in stable condition.   ESTIMATED BLOOD LOSS:  Less than 50 mL.   BRIEF HISTORY:  Ms. [name] is a 40 year old white female, who is referred  for evaluation of chronic sinus complaints of headache, nasal pressure and  congestion.  She had been treated with multiple courses of antibiotics and  medical therapy, and sinus infections had resolved.  She was left with heavy  nasal congestion and deviated nasal septum with turbinate hypertrophy.  Given the patient's history, examination, findings, and failure to  completely respond to medical therapy, I recommend that we undertake nasal  septoplasty and turbinate reduction under general anesthesia.  The risks,  benefits, and possible complications of the procedures were discussed in  detail with the patient, who understood and concurred with our plan for  surgery which is scheduled as above.   SURGICAL PROCEDURE:  The patient is brought to the operating room on December 12, 2003, and placed in a supine position on the operating table.  General  endotracheal anesthesia is established without difficulty, and the patient  is adequately anesthetized.  Her nose is injected with 7 mL of 1% lidocaine  1:100,000 solution of epinephrine injected in submucosal fashion along the  patient's nasal septum and inferior turbinates.  The patient's nose is then  packed with Afrin-soaked cottonoid pledgets which were left in place for  approximately 5 minutes to allow for vasoconstriction and anesthesia.  The  pledgets were removed, and the patient's nasal cavity was examined.  Left  anterior hemi-transfixion incision was created.  This was carried through  the mucosa and underlying submucosa with a #15 scalpel blade.  A  mucoperichondrial flap was elevated from anterior to posterior on the  patient's left-hand side.  Bony cartilaginous junction was crossed at the  midline, and a mucoperiosteal flap was elevated on the patient's right-hand  side.  Deviated bone and cartilage from the mid and posterior aspects of the  nasal septum were then resected.  There was a large septal spur which was  resected after mobilizing the overlying mucosa.  The anterior dorsal and  __________ cartilage was not deviated, and this was left intact throughout  the procedure.  Resected mid septal cartilage was morselized and returned to  the mucoperichondrial pocket, and the mucoperichondrial flaps were  reapproximated with a 4-0 gut suture on a Keith needle in a horizontal  mattressing fashion.  Hemi-transfixion incision was closed with the same  stitch, and bilateral Doyle nasal septal splints were placed after the  application of Bactroban ointment on each side and sutured in position with  a 3-0 Ethilon suture.   Inferior turbinate reduction was then performed with the bipolar intramural  cautery set at 12 watts.  __________ were made in the submucosal fascia at  each inferior turbinate.  When the turbinates were adequately cauterized.  They were outfractured more  patent nasal cavity.  The patient's nasal cavity  and nasopharynx were then irrigated and suctioned.  Oral cavity was  suctioned.  Oral gastric tube was passed.  The patient was awakened from her  anesthetic.  She was extubated and was then transferred from the operating  room to the recovery room in stable condition.  There were no complications,  and blood loss was less than 50 mL.                                               Kinnie Scales. Annalee Genta, M.D.    DLS/MEDQ  D:  29/51/8841  T:  12/13/2003  Job:  [id number]

## 2010-11-05 NOTE — Discharge Summary (Signed)
[facility]  Patient:    [name] Visit Number: [id number] MRN: [id number]          Service Type: OBS Location: 910A 9110 01 Attending Physician:  Wetzel Bjornstad Dictated by:   Antony Contras, [facility] Admit Date:  03/20/2001 Discharge Date: 03/22/2001                             Discharge Summary  DISCHARGE DIAGNOSES:          1. Intrauterine pregnancy at term.                               2. Spontaneous onset of labor.  PROCEDURES:                   Low forceps assisted vaginal delivery over                               a midline episiotomy with fourth degree                               extension.  HISTORY OF PRESENT ILLNESS:   The patient is a 40 year old, gravida 2, para 0-0-1-0, with an EDC of March 28, 2001. Prenatal risk factors include a history of cryosurgery and D&C in 2000, and also positive ASCUS and CIN-1.  PRENATAL LABORATORY DATA:     Blood type A positive, antibody screen negative, RPR, HBsAg, HIV nonreactive, MSAFP normal, GBS is negative.  HOSPITAL COURSE/TREATMENT:    Patient was admitted on March 20, 2001 with spontaneous onset of labor. Cervix was 2 cm, 80%, and -2 station. She did progress to complete dilatation, was found to have an OP presentation. The patient was offered options of C-section delivery versus low forceps. Delivery was accomplished by low forceps delivery of a viable female infant weighing 8 pounds 8 ounces over midline episiotomy with fourth degree extension. Also, labial laceration was repaired separate from the midline.  Postpartum, the patient remained afebrile, had no difficulty voiding, was able to be discharged on her second postpartum day in satisfactory condition.  CBC:  Hematocrit 23.3, hemoglobin 7.9, WBCs 11.5, platelets 334,000.  DISPOSITION:                  The patient is to follow up in six weeks.  DISCHARGE MEDICATIONS:        The patient is to continued prenatal  vitamins and iron with Motrin and Tylox for pain. Dictated by:   Antony Contras, [facility] Attending Physician:  Wetzel Bjornstad DD:  04/06/01 TD:  04/08/01 Job: [id number]

## 2010-11-05 NOTE — H&P (Signed)
NAMELUJAIN, KRASZEWSKI                       ACCOUNT NO.:  [id number]   MEDICAL RECORD NO.:  [id number]                   PATIENT TYPE:  OIB   LOCATION:  2899                                 FACILITY:  [facility]   PHYSICIAN:  Hilda Lias, M.D.                DATE OF BIRTH:  [birth year]   DATE OF ADMISSION:  12/02/2002  DATE OF DISCHARGE:                                HISTORY & PHYSICAL   This is a 40 year old lady who four years ago underwent a left L5, S1  diskectomy.  The patient did very well.  I have not seen her since March  2000 and since then she got married, she had a baby, and for the past four  months she has been complaining of back pain which for the past six weeks is  getting worse, going down the left leg with a pin sensation, quite painful,  up to the point that __________  has not improved.  The patient feels that  this is almost the same as what she had before.  She had an MRI and because  of the findings, she wants to proceed with surgery.   PAST MEDICAL HISTORY:  L5, S1 diskectomy four years ago.  She is not  allergic to any medications.  She is 5' 10, 220 pounds.  The patient does  not smoke.  She drinks socially.   FAMILY HISTORY:  Negative.   REVIEW OF SYSTEMS:  Negative except for back and left leg pain.   PHYSICAL EXAMINATION:  The patient came to my office limping from the left  leg.  HEENT:  Normal.  LUNGS:  Clear.  HEART:  Sounds normal.  ABDOMEN:  Normal.  NEUROLOGIC:  Neck is supple.  Cranial nerves are normal.  Strength normal  except she had weakness of the plantar flexor of the left foot, 2-3/5.  Reflexes are present with absent of the left ankle jerk. Straight leg  raising (SLR) of the right side is positive at 60 degrees, left side 30  degrees.  The patient has a highly positive sciatic notch on the left side.   The MRI is quite impressive.  This lady has a large, recurrent disk at L5-S1  on the left side.   IMPRESSION:  Left L5-S1  severe disk, recurrent.   The patient to see the surgeon.  The procedure will be the same approach  using a microscope.  She knows all the risks such as infection, __________  , worsening of pain, paralysis, numbness, damage to the  vessel of the  abdomen, need for further surgery.                                                Hilda Lias, M.D.    EB/MEDQ  D:  12/02/2002  T:  12/02/2002  Job:  [id number]

## 2010-11-05 NOTE — Op Note (Signed)
NAME:  [name], [name]                       ACCOUNT NO.:  [id number]   MEDICAL RECORD NO.:  [id number]                   PATIENT TYPE:  OIB   LOCATION:  3172                                 FACILITY:  [facility]   PHYSICIAN:  Hilda Lias, M.D.                DATE OF BIRTH:  1971-03-09   DATE OF PROCEDURE:  12/02/2002  DATE OF DISCHARGE:                                 OPERATIVE REPORT   PREOPERATIVE DIAGNOSIS:  Recurrent left L5-S1 herniated disk.   POSTOPERATIVE DIAGNOSIS:  Recurrent left L5-S1 herniated disk.   OPERATION PERFORMED:  Left L5-S1 diskectomy, removal of large fragment,  lysis of adhesions, foraminotomy.  Microscope.   SURGEON:  Hilda Lias, M.D.   ASSISTANT:  Cristi Loron, M.D.   ANESTHESIA:  General.   INDICATIONS FOR PROCEDURE:  [name] is a 40 year old female who four  years ago underwent 4-5 and 5-1 diskectomy.  The patient did well but  lately, for the past few months, has been complaining of back pain with  radiation down to the left leg.  MRI shows a large recurrent disk.  The  patient knew of the risks, especially with her obesity.  She is 295 pounds.  She declined more opinions.   DESCRIPTION OF PROCEDURE:  The patient was taken to the operating room and  was positioned in a prone manner.  The back was prepped with Betadine.  The  previous scar was removed and we went through the skin through thick adipose  tissue until we were able to find the area where she had surgery before.  The muscle was retracted laterally.  We brought the microscope into the area  immediately because that was the only way to bring light into the area.  With the microcurets, we removed the previous scar tissue.  With the drill,  we drilled part of the upper S1.  We __________ 1 and 2 mm Kerrison punch  and going through the foramen and then lateral and superior we were able to  do lysis of adhesions.  The S1 nerve root was swollen and displaced  posterior and  medial.  Lysis was accomplished.  We were able to retract  slightly the S1 nerve root and then we entered into the disk space.  There  was a large fragment of disk.  From then on we entered the disk space and  total medial and lateral diskectomy was accomplished.  Foraminotomy was  done.  At the end, we had decompression.  Valsalva maneuver was negative.  The wound was closed with Vicryl and Steri-Strips.                                                Hilda Lias, M.D.    EB/MEDQ  D:  12/02/2002  T:  12/02/2002  Job:  [id number]

## 2010-11-05 NOTE — H&P (Signed)
NAMEBELLA, BRUMMET             ACCOUNT NO.:  [id number]   MEDICAL RECORD NO.:  [id number]          PATIENT TYPE:  INP   LOCATION:  3172                         FACILITY:  [facility]   PHYSICIAN:  Hilda Lias, M.D.   DATE OF BIRTH:  July 18, 1970   DATE OF ADMISSION:  11/23/2005  DATE OF DISCHARGE:                                HISTORY & PHYSICAL   HISTORY OF PRESENT ILLNESS:  Ms. [name] is a lady who was seen by me  initially at the beginning of this year complaining of back pain with  radiation down to the left leg for almost six months.  All of this started  when she found her dog dead and she lifted it and immediately developed  pain.  In the past, this lady underwent lumbar diskectomy at L5-S1, the last  one in June 2005, which is almost two years ago.  She started __________ and  she has failed conservative treatment.  She is getting worse.  She is  complaining of numbness sensation, lower back pain mostly going to the left  leg but also the right leg.  She was seen by me.  Conservative treatment was  given and because of no improvement surgery was advised.   PAST MEDICAL HISTORY:  Lumbar diskectomy x2.   ALLERGIES:  She is not allergic to any medications.   SOCIAL HISTORY:  She does not smoke.  She drinks socially.   FAMILY HISTORY:  Negative.   REVIEW OF SYSTEMS:  Positive for back and left leg pain.   PHYSICAL EXAMINATION:  GENERAL:  The patient presents to my office limping  on the left leg.  VITAL SIGNS:  She is 5 feet 10 inches, weight is 240 pounds.  HEENT:  Head is normal.  NECK:  Normal.  LUNGS:  Clear.  HEART: Sounds normal.  There were no murmurs.  EXTREMITIES:  1+ pulses.  NEUROLOGICAL:  She is completely normal except in the lumbar area.  She has  decreased reflex ability of the lumbar spine and she has positive sciatic  notch angle tenderness on the left side, negative on the right side.  She  has some weakness of dorsiflexion of the left foot.  The right  one is  completely normal.  Reflexes show absence of the left __________.   LABORATORY DATA:  Normal spine x-ray plus MRI reports herniated disk at the  L5-S1 with degenerative disk disease at this level with displacement of the  thecal sac.   CLINICAL IMPRESSION:  Degenerative disk disease with recurrent herniated  disk x3, L5-S1, left.   RECOMMENDATIONS:  The patient is being admitted for surgery.  The procedure  will be an L5-S1 diskectomy, interbody fusion and pedicle screws.  She knows  of the risks such as infection, CSF leak, worsening of the pain, paralysis,  and no improvement whatsoever.  The patient declines a second opinion.           ______________________________  Hilda Lias, M.D.     EB/MEDQ  D:  11/23/2005  T:  11/23/2005  Job:  [id number]

## 2010-11-05 NOTE — H&P (Signed)
[facility]  Patient:    [name], [name] Visit Number: [id number] MRN: [id number]          Service Type: OBS Location: 910B 9160 01 Attending Physician:  Wetzel Bjornstad Dictated by:   Katy Fitch, M.D. Admit Date:  03/20/2001                           History and Physical  CHIEF COMPLAINT:              Labor.  HISTORY OF PRESENT ILLNESS:   Patient is a 40 year old G2, P0 at 51 weeks, who comes into triage complaining of contractions every five minutes.  The patient states that she has been laboring for several hours but denies any vaginal bleeding, rupture of membranes, fevers, nausea, vomiting, headache, chills or change in bowel or bladder habits.  Patient has had an uneventful prenatal course.  PAST MEDICAL HISTORY:         None.  PAST SURGICAL HISTORY:        Cryosurgery and D&C in 2000.  PAST GYNECOLOGICAL HISTORY:   Positive ASCUS and CIN-1, surgery -- D&C.  MEDICATIONS:                  Prenatal vitamins.  ALLERGIES:                    None.  FAMILY HISTORY:               Without any mental retardation or epithelial cancers.  SOCIAL HISTORY:               Without any tobacco, alcohol or drugs.  PRENATAL LABORATORY DATA:     A-positive, rubella immune, GBS negative.  PHYSICAL EXAMINATION:  VITAL SIGNS:                  Blood pressure 122/70.  HEENT:                        Throat clear.  LUNGS:                        Clear to auscultation bilaterally.  HEART:                        Regular rate and rhythm.  ABDOMEN:                      Gravid and nontender.  PELVIC:                       Cervix 2, 80, -2.  PLAN:                         AROM with internal monitors.  Patient will get an epidural and is GBS negative so no need for penicillin.  Will place IUPC to see if necessitating Pitocin. Dictated by:   Katy Fitch, M.D. Attending Physician:  Wetzel Bjornstad DD:  03/20/01 TD:  03/20/01 Job:  [id number]

## 2010-12-30 ENCOUNTER — Ambulatory Visit (INDEPENDENT_AMBULATORY_CARE_PROVIDER_SITE_OTHER): Payer: BLUE CROSS/BLUE SHIELD | Admitting: General Surgery

## 2011-01-27 ENCOUNTER — Encounter (INDEPENDENT_AMBULATORY_CARE_PROVIDER_SITE_OTHER): Payer: Self-pay | Admitting: General Surgery

## 2011-01-28 ENCOUNTER — Encounter (INDEPENDENT_AMBULATORY_CARE_PROVIDER_SITE_OTHER): Payer: Self-pay | Admitting: General Surgery

## 2011-01-28 ENCOUNTER — Ambulatory Visit (INDEPENDENT_AMBULATORY_CARE_PROVIDER_SITE_OTHER): Payer: BLUE CROSS/BLUE SHIELD | Admitting: General Surgery

## 2011-01-28 NOTE — Patient Instructions (Signed)
Exercise 4 times per week for 40 minutes. Followup appointment with dietician. We will see her back in 3 months.

## 2011-01-28 NOTE — Progress Notes (Signed)
History: Patient returns for followup with a history of a LAP-BAND placement May 27, 2008. She was last seen here July 03, 2009. At that point she was having some weight regain and was felt to have inadequate restriction as well as some diet of choice issues. She had a half cc filter bring her to 6 cc. She states to eat initially she had very significant increase restriction and weight loss. She lost about 20 pounds but over the last year or so has gained this back plus a few more. On questioning she however continues to have symptoms of being quite adequately or even over restricted. She cannot tolerate solid food in the morning. She frequently cannot tolerate solid food at all even in the afternoon sore evenings. She will have regurgitation at least a couple times a week. She will eat some soft foods when this occurs. She has been through [employer] and has been unable to eat enough points for this. She has not been exercising. She states she tried this for a couple of weeks and noted weight regain.  Exam: Obese but otherwise healthy. Abdomen soft and nontender port site looks fine.  Assessment and plan: She has at least adequate restriction and I suspect is over restricted. I think she has some maladaptive eating. Also she is not exercising. I recommended taking out a small amount of fluid to allow her to eat greater variety of appropriate foods. We also discussed the importance of exercise and I stressed she needs to be exercising for 40 minutes 4 times per week.  I removed 0.25 cc from her band. We also will get her a followup appointment with the dietitian. With these interventions I think she can still have success. I will see her back in 3 months.

## 2011-03-15 ENCOUNTER — Inpatient Hospital Stay: Admission: RE | Admit: 2011-03-15 | Payer: BLUE CROSS/BLUE SHIELD | Source: Ambulatory Visit

## 2011-03-15 ENCOUNTER — Ambulatory Visit: Payer: BLUE CROSS/BLUE SHIELD | Admitting: Gynecology

## 2011-03-17 ENCOUNTER — Encounter: Payer: BLUE CROSS/BLUE SHIELD | Admitting: Gynecology

## 2011-03-21 ENCOUNTER — Ambulatory Visit (INDEPENDENT_AMBULATORY_CARE_PROVIDER_SITE_OTHER): Payer: BLUE CROSS/BLUE SHIELD | Admitting: Gynecology

## 2011-03-21 ENCOUNTER — Encounter: Payer: Self-pay | Admitting: Gynecology

## 2011-03-21 ENCOUNTER — Ambulatory Visit (INDEPENDENT_AMBULATORY_CARE_PROVIDER_SITE_OTHER): Payer: BLUE CROSS/BLUE SHIELD

## 2011-03-21 ENCOUNTER — Other Ambulatory Visit: Payer: Self-pay | Admitting: Gynecology

## 2011-03-21 DIAGNOSIS — R102 Pelvic and perineal pain: Secondary | ICD-10-CM

## 2011-03-21 DIAGNOSIS — N949 Unspecified condition associated with female genital organs and menstrual cycle: Secondary | ICD-10-CM

## 2011-03-21 DIAGNOSIS — D3910 Neoplasm of uncertain behavior of unspecified ovary: Secondary | ICD-10-CM

## 2011-03-21 DIAGNOSIS — N83209 Unspecified ovarian cyst, unspecified side: Secondary | ICD-10-CM

## 2011-03-21 NOTE — Progress Notes (Signed)
Patient follows up for ultrasound. She was seen in April with some physiologic changes in her ovary was concerned about ovarian cancer, that a CA 125 which returned 7.6. She's here for follow up ultrasounds of her pain comes and goes but does disappear seems oscillated between right and left side. Her ultrasound today shows physiologic changes both ovaries but no significant abnormalities. Her previously seen cyst on her right ovary has resolved she has a corpus luteum on her left now. We discussed this I think she is having physiologic discomfort from ovulation.  I reviewed the options to include expectant management, ovarian suppression with oral contraceptives or Depo-Lupron up to and including salpingo-oophorectomy. Patient is comfortable for observation.  She is due for her annual she will make an appointment sometime in the next month or so and then follow up with me at that time.

## 2011-03-25 LAB — COMPREHENSIVE METABOLIC PANEL
ALT: 25 U/L (ref 0–35)
AST: 23 U/L (ref 0–37)
Albumin: 3.8 g/dL (ref 3.5–5.2)
Alkaline Phosphatase: 83 U/L (ref 39–117)
BUN: 13 mg/dL (ref 6–23)
CO2: 27 mEq/L (ref 19–32)
Calcium: 9 mg/dL (ref 8.4–10.5)
Chloride: 106 mEq/L (ref 96–112)
Creatinine, Ser: 0.83 mg/dL (ref 0.4–1.2)
GFR calc Af Amer: >60 mL/min (ref >60)
GFR calc non Af Amer: >60 mL/min (ref >60)
Glucose, Bld: 101 mg/dL — ABNORMAL HIGH (ref 70–99)
Potassium: 3.7 mEq/L (ref 3.5–5.1)
Sodium: 136 mEq/L (ref 135–145)
Total Bilirubin: 0.9 mg/dL (ref 0.3–1.2)
Total Protein: 6.8 g/dL (ref 6.0–8.3)

## 2011-03-25 LAB — CBC
HCT: 40.3 % (ref 36.0–46.0)
Hemoglobin: 13.5 g/dL (ref 12.0–15.0)
MCHC: 33.4 g/dL (ref 30.0–36.0)
MCV: 87.8 fL (ref 78.0–100.0)
Platelets: 295 10*3/uL (ref 150–400)
RBC: 4.59 MIL/uL (ref 3.87–5.11)
RDW: 13.9 % (ref 11.5–15.5)
WBC: 5.9 10*3/uL (ref 4.0–10.5)

## 2011-03-25 LAB — DIFFERENTIAL
Basophils Absolute: 0 10*3/uL (ref 0.0–0.1)
Basophils Relative: 1 % (ref 0–1)
Eosinophils Absolute: 0.3 10*3/uL (ref 0.0–0.7)
Eosinophils Relative: 5 % (ref 0–5)
Lymphocytes Relative: 33 % (ref 12–46)
Lymphs Abs: 1.9 10*3/uL (ref 0.7–4.0)
Monocytes Absolute: 0.5 10*3/uL (ref 0.1–1.0)
Monocytes Relative: 9 % (ref 3–12)
Neutro Abs: 3.1 10*3/uL (ref 1.7–7.7)
Neutrophils Relative %: 53 % (ref 43–77)

## 2011-03-25 LAB — PREGNANCY, URINE: Preg Test, Ur: NEGATIVE

## 2011-06-06 ENCOUNTER — Telehealth: Payer: Self-pay | Admitting: *Deleted

## 2011-06-06 MED ORDER — FLUCONAZOLE 150 MG PO TABS: 150.0000 mg | ORAL_TABLET | Freq: Once | ORAL | Status: DC

## 2011-06-06 MED ORDER — FLUCONAZOLE 150 MG PO TABS: 150.0000 mg | ORAL_TABLET | Freq: Once | ORAL | Status: AC

## 2011-06-06 NOTE — Telephone Encounter (Addendum)
Pt informed with the below note. Called [facility] to cancel rx for diflucan 150 mg and sent to [facility] [place], pt wanted rx sent there.

## 2011-06-06 NOTE — Telephone Encounter (Signed)
Diflucan 150mg x 1 dose

## 2011-06-06 NOTE — Telephone Encounter (Signed)
Pt calling c/o yeast infection white discharge, itching only. Pt has annual scheduled for 06/15/11. She would like to something to help relieve itching if possible until her annual. Please advise.

## 2011-06-15 ENCOUNTER — Ambulatory Visit (INDEPENDENT_AMBULATORY_CARE_PROVIDER_SITE_OTHER): Payer: BC Managed Care – PPO | Admitting: Gynecology

## 2011-06-15 ENCOUNTER — Encounter: Payer: Self-pay | Admitting: Gynecology

## 2011-06-15 VITALS — BP 128/86 | Ht 68.25 in | Wt 297.0 lb

## 2011-06-15 DIAGNOSIS — Z13220 Encounter for screening for lipoid disorders: Secondary | ICD-10-CM

## 2011-06-15 DIAGNOSIS — Z01419 Encounter for gynecological examination (general) (routine) without abnormal findings: Secondary | ICD-10-CM

## 2011-06-15 DIAGNOSIS — M129 Arthropathy, unspecified: Secondary | ICD-10-CM

## 2011-06-15 DIAGNOSIS — Z131 Encounter for screening for diabetes mellitus: Secondary | ICD-10-CM

## 2011-06-15 DIAGNOSIS — M1990 Unspecified osteoarthritis, unspecified site: Secondary | ICD-10-CM

## 2011-06-15 NOTE — Patient Instructions (Signed)
Follow up annually for GYN checkup

## 2011-06-15 NOTE — Progress Notes (Signed)
[name] [birth year] [id number]        40 y.o.  for annual exam.  Doing well without complaints. She is having some issues with some left knee arthritis and her primary asked her to have a uric acid drawn to rule out gout. She is status post LAVH in the past for bleeding.  Past medical history,surgical history, medications, allergies, family history and social history were all reviewed and documented in the EPIC chart. ROS:  Was performed and pertinent positives and negatives are included in the history.  Exam: chaperone present Filed Vitals:   06/15/11 1222  BP: 128/86   General appearance  Normal Skin grossly normal Head/Neck normal with no cervical or supraclavicular adenopathy thyroid normal Lungs  clear Cardiac RR, without RMG Abdominal  soft, nontender, without masses, organomegaly or hernia Breasts  examined lying and sitting without masses, retractions, discharge or axillary adenopathy. Pelvic  Ext/BUS/vagina  normal   Adnexa  Without masses or tenderness    Anus and perineum  normal   Rectovaginal  normal sphincter tone without palpated masses or tenderness.    Assessment/Plan:  40 y.o. female for annual exam.    1. History of ovarian cyst. Patient has history of functional ovarian cysts causing discomfort. Most recent ultrasound was in October 2012 which was normal. She has occasional mittelschmerz type pain alternating side to side acceptable to her and she'll continue to monitor. As long as no persistent discomfort or other symptoms then we'll watch. 2. Pap smear. Patient has no history of abnormal Pap smears in the past. She has multiple normal records in her chart the last 2011. She is status post LAVH for benign indications. I discussed current screening guidelines and the options of stopping altogether versus going to less frequent screening interval was reviewed and we'll readdress this annually. No Pap was done today. 3. Breast health. SBE monthly reviewed.  Recommended screening mammogram this coming year she turns 40 and she agrees to arrange this. 4. Health maintenance. Baseline CBC lipid profile glucose urinalysis and uric acid was ordered. Assuming she continues well from a gynecologic standpoint she'll see [place] in a year sooner as needed.    Dara Lords MD, 1:24 PM 06/15/2011

## 2011-06-16 ENCOUNTER — Other Ambulatory Visit: Payer: Self-pay | Admitting: *Deleted

## 2011-06-16 DIAGNOSIS — E7800 Pure hypercholesterolemia, unspecified: Secondary | ICD-10-CM

## 2011-06-16 LAB — GLUCOSE, RANDOM: Glucose, Bld: 89 mg/dL (ref 70–99)

## 2011-06-16 LAB — LIPID PANEL
Cholesterol: 260 mg/dL — ABNORMAL HIGH (ref 0–200)
HDL: 55 mg/dL (ref >39)
LDL Cholesterol: 172 mg/dL — ABNORMAL HIGH (ref 0–99)
Total CHOL/HDL Ratio: 4.7 Ratio
Triglycerides: 165 mg/dL — ABNORMAL HIGH (ref <150)
VLDL: 33 mg/dL (ref 0–40)

## 2011-06-16 LAB — URIC ACID: Uric Acid, Serum: 4.9 mg/dL (ref 2.4–7.0)

## 2011-06-17 ENCOUNTER — Other Ambulatory Visit: Payer: BC Managed Care – PPO

## 2011-07-14 ENCOUNTER — Encounter (INDEPENDENT_AMBULATORY_CARE_PROVIDER_SITE_OTHER): Payer: Self-pay | Admitting: General Surgery

## 2011-08-22 ENCOUNTER — Encounter: Payer: Self-pay | Admitting: Gynecology

## 2011-12-26 ENCOUNTER — Encounter (HOSPITAL_COMMUNITY): Payer: Self-pay | Admitting: Emergency Medicine

## 2011-12-26 ENCOUNTER — Emergency Department (HOSPITAL_COMMUNITY)
Admission: EM | Admit: 2011-12-26 | Discharge: 2011-12-26 | Disposition: A | Discharge status: Home / Self Care | Payer: BC Managed Care – PPO | Attending: Emergency Medicine | Admitting: Emergency Medicine

## 2011-12-26 ENCOUNTER — Emergency Department (HOSPITAL_COMMUNITY): Payer: BC Managed Care – PPO

## 2011-12-26 DIAGNOSIS — R0602 Shortness of breath: Secondary | ICD-10-CM | POA: Insufficient documentation

## 2011-12-26 DIAGNOSIS — R079 Chest pain, unspecified: Secondary | ICD-10-CM | POA: Insufficient documentation

## 2011-12-26 DIAGNOSIS — R0789 Other chest pain: Secondary | ICD-10-CM

## 2011-12-26 DIAGNOSIS — R071 Chest pain on breathing: Secondary | ICD-10-CM | POA: Insufficient documentation

## 2011-12-26 DIAGNOSIS — R61 Generalized hyperhidrosis: Secondary | ICD-10-CM | POA: Insufficient documentation

## 2011-12-26 DIAGNOSIS — R002 Palpitations: Secondary | ICD-10-CM | POA: Insufficient documentation

## 2011-12-26 DIAGNOSIS — M7989 Other specified soft tissue disorders: Secondary | ICD-10-CM | POA: Insufficient documentation

## 2011-12-26 LAB — BASIC METABOLIC PANEL
BUN: 11 mg/dL (ref 6–23)
CO2: 22 mEq/L (ref 19–32)
Calcium: 9.4 mg/dL (ref 8.4–10.5)
Chloride: 102 mEq/L (ref 96–112)
Creatinine, Ser: 0.73 mg/dL (ref 0.50–1.10)
GFR calc Af Amer: >90 mL/min (ref >90)
GFR calc non Af Amer: >90 mL/min (ref >90)
Glucose, Bld: 96 mg/dL (ref 70–99)
Potassium: 4.1 mEq/L (ref 3.5–5.1)
Sodium: 136 mEq/L (ref 135–145)

## 2011-12-26 LAB — CBC WITH DIFFERENTIAL/PLATELET
Basophils Absolute: 0.1 10*3/uL (ref 0.0–0.1)
Basophils Relative: 1 % (ref 0–1)
Eosinophils Absolute: 0.5 10*3/uL (ref 0.0–0.7)
Eosinophils Relative: 7 % — ABNORMAL HIGH (ref 0–5)
HCT: 41.2 % (ref 36.0–46.0)
Hemoglobin: 13.9 g/dL (ref 12.0–15.0)
Lymphocytes Relative: 22 % (ref 12–46)
Lymphs Abs: 1.7 10*3/uL (ref 0.7–4.0)
MCH: 29.7 pg (ref 26.0–34.0)
MCHC: 33.7 g/dL (ref 30.0–36.0)
MCV: 88 fL (ref 78.0–100.0)
Monocytes Absolute: 0.6 10*3/uL (ref 0.1–1.0)
Monocytes Relative: 8 % (ref 3–12)
Neutro Abs: 4.8 10*3/uL (ref 1.7–7.7)
Neutrophils Relative %: 63 % (ref 43–77)
Platelets: 283 10*3/uL (ref 150–400)
RBC: 4.68 MIL/uL (ref 3.87–5.11)
RDW: 13.6 % (ref 11.5–15.5)
WBC: 7.7 10*3/uL (ref 4.0–10.5)

## 2011-12-26 LAB — TROPONIN I: Troponin I: <0.3 ng/mL (ref <0.30)

## 2011-12-26 MED ORDER — HYDROCODONE-ACETAMINOPHEN 5-325 MG PO TABS: 1.0000 | ORAL_TABLET | ORAL | Status: AC | PRN

## 2011-12-26 MED ORDER — IOHEXOL 350 MG/ML SOLN
100.0000 mL | Freq: Once | INTRAVENOUS | Status: AC | PRN
  Administered 2011-12-26: 100 mL via INTRAVENOUS

## 2011-12-26 MED ORDER — IBUPROFEN 800 MG PO TABS: 800.0000 mg | ORAL_TABLET | Freq: Three times a day (TID) | ORAL | Status: AC

## 2011-12-26 NOTE — ED Notes (Signed)
Prescriptions x2 given with discharge instructions.  

## 2011-12-26 NOTE — ED Notes (Signed)
Pt. Reports chest pressure for the past month. States "It feels like indigestion but it's gotten worse the past 2 weeks".  Reports pain radiating across right lateral chest and in both elbows. States "I feel like if I could just burp it would go away. I've been taking tums all the time". Denies N/V/D.

## 2011-12-26 NOTE — ED Provider Notes (Signed)
9:18 AM  Date: 12/26/2011  Rate: 99  Rhythm: normal sinus rhythm  QRS Axis: left  Intervals: normal  ST/T Wave abnormalities: normal  Conduction Disutrbances:none  Narrative Interpretation: Normal EKG.  Old EKG Reviewed: none available    Carleene Cooper III, MD 12/26/11 [phone]

## 2011-12-26 NOTE — ED Notes (Signed)
Pt. Stated, I've been having CP for about 2 months and now with SOB and it radiates to my elbows.  In the last 2 weeks its been more.

## 2011-12-26 NOTE — ED Notes (Signed)
Pt ambulated independently into ED, c/o CP. "I have a lot of chest pressure". Taken to triage for EKG

## 2011-12-26 NOTE — ED Provider Notes (Signed)
History     CSN: [id number]  Arrival date & time 12/26/11  8295   First MD Initiated Contact with Patient 12/26/11 0915      Chief Complaint  Patient presents with  . Chest Pain    (Consider location/radiation/quality/duration/timing/severity/associated sxs/prior treatment) HPI Comments: [name] 41 y.o. female   The chief complaint is: Patient presents with:   Chest Pain   The patient has medical history significant for: family history of father who had MI at age 26  History reviewed.PMH significant for palpatations in 2006 with a negative echo performed at [facility] (per patient).  The onset of the symptoms was gradual. Two months ago the patient went on a business trip to [place]. She states that the flight was 9 hours and after landing she was extremely short of breath. Since this episode she has had intermittent chest pressure that has associated SOB that prevents her from sleeping supine. This morning the patient was awoken from sleep with chest pressure. The pressure is located midline of the chest. She describes the pressure as a dull ache and 3-4/10 on the pain scale. There is some transmission to the elbows bilaterally and the right side of her back. Patient also reports some diaphoresis during this mornings episode. There are no aggravating or alleviating factors. The patient took some of her husbands Prilosec without relief.  Denies fever or chills. Denies palpitations. Denies nausea, vomiting, or abdominal pain. Denies chest trauma. Denies cough, congestion, rhinorrhea. Denies smoking, use of OCP's, or HRT, or history of hypercoagulable state.       History reviewed. No pertinent past medical history.  Past Surgical History  Procedure Date  . Vaginal hysterectomy JUNE 2010    LAVH  . Laparoscopic gastric banding DEC. 8 2009  . Back surgery 2000, 2004. 2007    L5/L1 DISC  . Knee arthroscopy OCT 2008    Family History  Problem Relation Age of  Onset  . Heart disease Paternal Grandmother   . Heart disease Paternal Grandfather   . Heart disease Father     History  Substance Use Topics  . Smoking status: Never Smoker   . Smokeless tobacco: Never Used  . Alcohol Use: Yes     1 or 2 per week    OB History    Grav Para Term Preterm Abortions TAB SAB Ect Mult Living   2 1 1  1  1   1       Review of Systems  Constitutional: Positive for diaphoresis. Negative for fever and chills.  HENT: Negative for congestion and rhinorrhea.   Respiratory: Positive for shortness of breath. Negative for cough.   Cardiovascular: Positive for chest pain, palpitations and leg swelling.       Chest pressure more than pain.  Gastrointestinal: Negative for nausea, vomiting and abdominal pain.    Allergies  Review of patient's allergies indicates no known allergies.  Home Medications   Current Outpatient Rx  Name Route Sig Dispense Refill  . CALCIUM CARBONATE ANTACID 500 MG PO CHEW Oral Chew 4 tablets by mouth 3 (three) times daily as needed. heartburn    . OMEPRAZOLE 20 MG PO CPDR Oral Take 20 mg by mouth daily. Just began taking today.      BP 128/93  Pulse 94  Temp 98.3 F (36.8 C)  Resp 20  SpO2 99%  Physical Exam  Constitutional: She appears well-developed and well-nourished.  HENT:  Head: Normocephalic and atraumatic.  Cardiovascular: Normal rate, regular  rhythm, normal heart sounds and intact distal pulses.        Patient reports swelling in lower extremities but no pitting edema noted on exam.  Pulmonary/Chest: Breath sounds normal.       Mild shortness of breath.  Abdominal: Soft. Bowel sounds are normal. There is no tenderness.  Neurological: She is alert.  Skin: Skin is dry.    ED Course  Procedures (including critical care time)  Results for orders placed during the hospital encounter of 12/26/11  TROPONIN I      Component Value Range   Troponin I <0.30  <0.30 ng/mL  CBC WITH DIFFERENTIAL      Component  Value Range   WBC 7.7  4.0 - 10.5 K/uL   RBC 4.68  3.87 - 5.11 MIL/uL   Hemoglobin 13.9  12.0 - 15.0 g/dL   HCT 16.1  09.6 - 04.5 %   MCV 88.0  78.0 - 100.0 fL   MCH 29.7  26.0 - 34.0 pg   MCHC 33.7  30.0 - 36.0 g/dL   RDW 40.9  81.1 - 91.4 %   Platelets 283  150 - 400 K/uL   Neutrophils Relative 63  43 - 77 %   Neutro Abs 4.8  1.7 - 7.7 K/uL   Lymphocytes Relative 22  12 - 46 %   Lymphs Abs 1.7  0.7 - 4.0 K/uL   Monocytes Relative 8  3 - 12 %   Monocytes Absolute 0.6  0.1 - 1.0 K/uL   Eosinophils Relative 7 (*) 0 - 5 %   Eosinophils Absolute 0.5  0.0 - 0.7 K/uL   Basophils Relative 1  0 - 1 %   Basophils Absolute 0.1  0.0 - 0.1 K/uL  BASIC METABOLIC PANEL      Component Value Range   Sodium 136  135 - 145 mEq/L   Potassium 4.1  3.5 - 5.1 mEq/L   Chloride 102  96 - 112 mEq/L   CO2 22  19 - 32 mEq/L   Glucose, Bld 96  70 - 99 mg/dL   BUN 11  6 - 23 mg/dL   Creatinine, Ser 7.82  0.50 - 1.10 mg/dL   Calcium 9.4  8.4 - 95.6 mg/dL   GFR calc non Af Amer >90  >90 mL/min   GFR calc Af Amer >90  >90 mL/min      No diagnosis found.    MDM  Patient is a 41 y/o female with no significant medical history who present with chest pressure and SOB. Patient states that these symptoms began and have persisted for 2 months after a 9 hour flight to [place]. EKG, CBC, CMP, Troponin, unremarkable. CT angio negative for PE. Pain is reproducible on exam leading towards a diagnosis of costochondritis. Patient will be discharged on antiinflammatories and will follow up with PCP if symptoms worsen or do not improve.        Pixie Casino, PA-C 12/26/11 1532

## 2011-12-27 NOTE — ED Provider Notes (Signed)
Medical screening examination/treatment/procedure(s) were performed by non-physician practitioner and as supervising physician I was immediately available for consultation/collaboration.   Carleene Cooper III, MD 12/27/11 1022

## 2013-06-21 ENCOUNTER — Encounter: Payer: Self-pay | Admitting: Gynecology

## 2013-07-25 ENCOUNTER — Encounter: Payer: Self-pay | Admitting: Gynecology

## 2013-08-21 ENCOUNTER — Encounter: Payer: Self-pay | Admitting: Gynecology

## 2013-09-13 ENCOUNTER — Ambulatory Visit: Payer: Self-pay | Admitting: Gynecology

## 2013-10-17 ENCOUNTER — Telehealth (HOSPITAL_COMMUNITY): Payer: Self-pay

## 2013-10-17 NOTE — Telephone Encounter (Signed)
Patient called the Bariatric Office this afternoon returning my previous call/message with regards to an attempt to reestablish bariatric post-op care with [facility]. She provided her new mailing address on [address] & I updated it from the previous location at [address] She advised that she was just recently considering an appointment at [place] since she feels her band may be too tight and she is having difficulty eating. I confirmed her need to be seen for possible fluid removal in addition to the need for monitoring possible nutritional deficiencies since she advised that she is eating very little. I offered to transfer her directly to a scheduler at [place] today, but she was not able to schedule an appointment at the time of her call. She confirmed that she has their contact information and will be calling them directly herself to schedule an appointment very soon. Routing this message to Dr. Excell Seltzer in addition to [place] at [place] so she can follow-up with patient in the next few days if patient has yet to call herself.  Alphonsa Overall. [facility] Bariatric Office Coordinator [phone]

## 2013-10-17 NOTE — Telephone Encounter (Signed)
This patient is overdue for recommended follow-up with a bariatric surgeon at [facility]. Call attempted today to reestablish post-op care with [facility], but unable to reach patient by phone.  A letter will be mailed to the patient today to the address on file from  & [facility] advising the patient on the benefits of follow-up care and directing them to call [facility] at [phone] to schedule an appointment at their earliest convenience.  ° °Amanda T. Fleming °Bariatric Office Coordinator °[phone] ° °

## 2013-10-22 ENCOUNTER — Other Ambulatory Visit (HOSPITAL_COMMUNITY)
Admission: RE | Admit: 2013-10-22 | Discharge: 2013-10-22 | Disposition: A | Discharge status: Home / Self Care | Payer: BC Managed Care – PPO | Source: Ambulatory Visit | Attending: Gynecology | Admitting: Gynecology

## 2013-10-22 ENCOUNTER — Ambulatory Visit (INDEPENDENT_AMBULATORY_CARE_PROVIDER_SITE_OTHER): Payer: BC Managed Care – PPO | Admitting: Gynecology

## 2013-10-22 ENCOUNTER — Encounter: Payer: Self-pay | Admitting: Gynecology

## 2013-10-22 VITALS — BP 130/84 | Ht 68.5 in | Wt 292.0 lb

## 2013-10-22 DIAGNOSIS — Z01419 Encounter for gynecological examination (general) (routine) without abnormal findings: Secondary | ICD-10-CM | POA: Insufficient documentation

## 2013-10-22 DIAGNOSIS — N949 Unspecified condition associated with female genital organs and menstrual cycle: Secondary | ICD-10-CM

## 2013-10-22 DIAGNOSIS — N393 Stress incontinence (female) (male): Secondary | ICD-10-CM

## 2013-10-22 DIAGNOSIS — R102 Pelvic and perineal pain: Secondary | ICD-10-CM

## 2013-10-22 NOTE — Patient Instructions (Addendum)
Followup for ultrasound as scheduled. Follow up in one year for annual exam. Followup for mammogram  Health Maintenance, Female A healthy lifestyle and preventative care can promote health and wellness.  Maintain regular health, dental, and eye exams.  Eat a healthy diet. Foods like vegetables, fruits, whole grains, low-fat dairy products, and lean protein foods contain the nutrients you need without too many calories. Decrease your intake of foods high in solid fats, added sugars, and salt. Get information about a proper diet from your caregiver, if necessary.  Regular physical exercise is one of the most important things you can do for your health. Most adults should get at least 150 minutes of moderate-intensity exercise (any activity that increases your heart rate and causes you to sweat) each week. In addition, most adults need muscle-strengthening exercises on 2 or more days a week.   Maintain a healthy weight. The body mass index (BMI) is a screening tool to identify possible weight problems. It provides an estimate of body fat based on height and weight. Your caregiver can help determine your BMI, and can help you achieve or maintain a healthy weight. For adults 20 years and older:  A BMI below 18.5 is considered underweight.  A BMI of 18.5 to 24.9 is normal.  A BMI of 25 to 29.9 is considered overweight.  A BMI of 30 and above is considered obese.  Maintain normal blood lipids and cholesterol by exercising and minimizing your intake of saturated fat. Eat a balanced diet with plenty of fruits and vegetables. Blood tests for lipids and cholesterol should begin at age 23 and be repeated every 5 years. If your lipid or cholesterol levels are high, you are over 50, or you are a high risk for heart disease, you may need your cholesterol levels checked more frequently.Ongoing high lipid and cholesterol levels should be treated with medicines if diet and exercise are not effective.  If  you smoke, find out from your caregiver how to quit. If you do not use tobacco, do not start.  Lung cancer screening is recommended for adults aged 68 80 years who are at high risk for developing lung cancer because of a history of smoking. Yearly low-dose computed tomography (CT) is recommended for people who have at least a 30-pack-year history of smoking and are a current smoker or have quit within the past 15 years. A pack year of smoking is smoking an average of 1 pack of cigarettes a day for 1 year (for example: 1 pack a day for 30 years or 2 packs a day for 15 years). Yearly screening should continue until the smoker has stopped smoking for at least 15 years. Yearly screening should also be stopped for people who develop a health problem that would prevent them from having lung cancer treatment.  If you are pregnant, do not drink alcohol. If you are breastfeeding, be very cautious about drinking alcohol. If you are not pregnant and choose to drink alcohol, do not exceed 1 drink per day. One drink is considered to be 12 ounces (355 mL) of beer, 5 ounces (148 mL) of wine, or 1.5 ounces (44 mL) of liquor.  Avoid use of street drugs. Do not share needles with anyone. Ask for help if you need support or instructions about stopping the use of drugs.  High blood pressure causes heart disease and increases the risk of stroke. Blood pressure should be checked at least every 1 to 2 years. Ongoing high blood pressure should be  treated with medicines, if weight loss and exercise are not effective.  If you are 83 to 43 years old, ask your caregiver if you should take aspirin to prevent strokes.  Diabetes screening involves taking a blood sample to check your fasting blood sugar level. This should be done once every 3 years, after age 17, if you are within normal weight and without risk factors for diabetes. Testing should be considered at a younger age or be carried out more frequently if you are overweight  and have at least 1 risk factor for diabetes.  Breast cancer screening is essential preventative care for women. You should practice "breast self-awareness." This means understanding the normal appearance and feel of your breasts and may include breast self-examination. Any changes detected, no matter how small, should be reported to a caregiver. Women in their 9s and 30s should have a clinical breast exam (CBE) by a caregiver as part of a regular health exam every 1 to 3 years. After age 59, women should have a CBE every year. Starting at age 62, women should consider having a mammogram (breast X-ray) every year. Women who have a family history of breast cancer should talk to their caregiver about genetic screening. Women at a high risk of breast cancer should talk to their caregiver about having an MRI and a mammogram every year.  Breast cancer gene (BRCA)-related cancer risk assessment is recommended for women who have family members with BRCA-related cancers. BRCA-related cancers include breast, ovarian, tubal, and peritoneal cancers. Having family members with these cancers may be associated with an increased risk for harmful changes (mutations) in the breast cancer genes BRCA1 and BRCA2. Results of the assessment will determine the need for genetic counseling and BRCA1 and BRCA2 testing.  The Pap test is a screening test for cervical cancer. Women should have a Pap test starting at age 12. Between ages 55 and 75, Pap tests should be repeated every 2 years. Beginning at age 67, you should have a Pap test every 3 years as long as the past 3 Pap tests have been normal. If you had a hysterectomy for a problem that was not cancer or a condition that could lead to cancer, then you no longer need Pap tests. If you are between ages 37 and 40, and you have had normal Pap tests going back 10 years, you no longer need Pap tests. If you have had past treatment for cervical cancer or a condition that could lead to  cancer, you need Pap tests and screening for cancer for at least 20 years after your treatment. If Pap tests have been discontinued, risk factors (such as a new sexual partner) need to be reassessed to determine if screening should be resumed. Some women have medical problems that increase the chance of getting cervical cancer. In these cases, your caregiver may recommend more frequent screening and Pap tests.  The human papillomavirus (HPV) test is an additional test that may be used for cervical cancer screening. The HPV test looks for the virus that can cause the cell changes on the cervix. The cells collected during the Pap test can be tested for HPV. The HPV test could be used to screen women aged 73 years and older, and should be used in women of any age who have unclear Pap test results. After the age of 63, women should have HPV testing at the same frequency as a Pap test.  Colorectal cancer can be detected and often prevented. Most routine  colorectal cancer screening begins at the age of 53 and continues through age 52. However, your caregiver may recommend screening at an earlier age if you have risk factors for colon cancer. On a yearly basis, your caregiver may provide home test kits to check for hidden blood in the stool. Use of a small camera at the end of a tube, to directly examine the colon (sigmoidoscopy or colonoscopy), can detect the earliest forms of colorectal cancer. Talk to your caregiver about this at age 25, when routine screening begins. Direct examination of the colon should be repeated every 5 to 10 years through age 3, unless early forms of pre-cancerous polyps or small growths are found.  Hepatitis C blood testing is recommended for all people born from [birth year] through 1965 and any individual with known risks for hepatitis C.  Practice safe sex. Use condoms and avoid high-risk sexual practices to reduce the spread of sexually transmitted infections (STIs). Sexually active women  aged 60 and younger should be checked for Chlamydia, which is a common sexually transmitted infection. Older women with new or multiple partners should also be tested for Chlamydia. Testing for other STIs is recommended if you are sexually active and at increased risk.  Osteoporosis is a disease in which the bones lose minerals and strength with aging. This can result in serious bone fractures. The risk of osteoporosis can be identified using a bone density scan. Women ages 59 and over and women at risk for fractures or osteoporosis should discuss screening with their caregivers. Ask your caregiver whether you should be taking a calcium supplement or vitamin D to reduce the rate of osteoporosis.  Menopause can be associated with physical symptoms and risks. Hormone replacement therapy is available to decrease symptoms and risks. You should talk to your caregiver about whether hormone replacement therapy is right for you.  Use sunscreen. Apply sunscreen liberally and repeatedly throughout the day. You should seek shade when your shadow is shorter than you. Protect yourself by wearing long sleeves, pants, a wide-brimmed hat, and sunglasses year round, whenever you are outdoors.  Notify your caregiver of new moles or changes in moles, especially if there is a change in shape or color. Also notify your caregiver if a mole is larger than the size of a pencil eraser.  Stay current with your immunizations. Document Released: 12/20/2010 Document Revised: 10/01/2012 Document Reviewed: 12/20/2010 [facility] Patient Information 2014 [place].

## 2013-10-22 NOTE — Progress Notes (Signed)
[name] 1970/12/28 [id number]        42 y.o.  G2P1011 for annual exam.  Several issues that are below.  Past medical history,surgical history, problem list, medications, allergies, family history and social history were all reviewed and documented as reviewed in the EPIC chart.  ROS:  12 system ROS performed with pertinent positives and negatives included in the history, assessment and plan.  Included Systems: General, HEENT, Neck, Cardiovascular, Pulmonary, Gastrointestinal, Genitourinary, Musculoskeletal, Dermatologic, Endocrine, Hematological, Neurologic, Psychiatric Additional significant findings :  Stress urinary incontinence symptoms   Exam: Kim assistant Filed Vitals:   10/22/13 1608  BP: 130/84  Height: 5' 8.5" (1.74 m)  Weight: 292 lb (132.45 kg)   General appearance:  Normal affect, orientation and appearance. Skin: Grossly normal HEENT: Without gross lesions.  No cervical or supraclavicular adenopathy. Thyroid normal.  Lungs:  Clear without wheezing, rales or rhonchi Cardiac: RR, without RMG Abdominal:  Soft, nontender, without masses, guarding, rebound, organomegaly or hernia Breasts:  Examined lying and sitting without masses, retractions, discharge or axillary adenopathy. Pelvic:  Ext/BUS/vagina normal cuff well supported no gross cystocele/rectocele   Adnexa  Without masses or tenderness    Anus and perineum  Normal   Rectovaginal  Normal sphincter tone without palpated masses or tenderness.    Assessment/Plan:  43 y.o. G17P1011 female for annual exam status post LAVH 2010 for bleeding.   1. Pelvic pain/mittelschmerz patient has a history of ovulatory pain with physiologic cysts in the past. Last week she had a bout of severe pain lasting several hours which has resolved. She notes the pain comes every several months and last for an hour or 2. She does have a history of irregular menses in the past before her hysterectomy. Recommend ultrasound now are  nonpalpable abnormalities. Assuming negative or consistent with physiologic cystic changes options for management include observation, pain medication, ovulatory suppression such as oral contraceptives up to and including removing her ovaries. At this point patient's most interesting considering intermittent pain medicine which I think is reasonable since his such an infrequent discomfort. Patient will followup for her ultrasound will further discuss. 2. Stress urinary incontinence. Patient is noticing more episodes of loss of urine with coughing sneezing laughing. No spontaneous loss of urine or urgency symptoms. Check urinalysis today. Options include Kegel exercises, biofeedback/physical therapy and surgery reviewed. Patient's try Kegel exercises at home and followup as needed. 3. Mammography 2013. I reminded the patient she is overdue and needs to schedule and she agrees to do so. SBE monthly reviewed. 4. Pap smear 2011. Pap done today the vaginal cuff. No history of significant abnormal Pap smears. Review current screening guidelines and options to stop screening altogether versus less frequent screening intervals reviewed. Will readdress on an annual basis. 5. Health maintenance. No routine lab work done as patient reports this done at her primary physician's office. Followup for ultrasound otherwise one year, sooner as needed.   Note: This document was prepared with digital dictation and possible smart phrase technology. Any transcriptional errors that result from this process are unintentional.   Anastasio Auerbach MD, 5:12 PM 10/22/2013

## 2013-10-23 LAB — URINALYSIS W MICROSCOPIC + REFLEX CULTURE
Bilirubin Urine: NEGATIVE
Casts: NONE SEEN
Crystals: NONE SEEN
Glucose, UA: NEGATIVE mg/dL
Hgb urine dipstick: NEGATIVE
Ketones, ur: NEGATIVE mg/dL
Leukocytes, UA: NEGATIVE
Nitrite: NEGATIVE
Protein, ur: NEGATIVE mg/dL
Specific Gravity, Urine: 1.02 (ref 1.005–1.030)
Squamous Epithelial / LPF: NONE SEEN
Urobilinogen, UA: 0.2 mg/dL (ref 0.0–1.0)
pH: 5.5 (ref 5.0–8.0)

## 2013-10-28 ENCOUNTER — Encounter (HOSPITAL_COMMUNITY): Payer: Self-pay | Admitting: Emergency Medicine

## 2013-10-28 ENCOUNTER — Emergency Department (HOSPITAL_COMMUNITY)
Admission: EM | Admit: 2013-10-28 | Discharge: 2013-10-28 | Disposition: A | Discharge status: Home / Self Care | Payer: BC Managed Care – PPO | Attending: Emergency Medicine | Admitting: Emergency Medicine

## 2013-10-28 ENCOUNTER — Emergency Department (HOSPITAL_COMMUNITY): Payer: BC Managed Care – PPO

## 2013-10-28 DIAGNOSIS — S0990XA Unspecified injury of head, initial encounter: Secondary | ICD-10-CM

## 2013-10-28 DIAGNOSIS — R51 Headache: Secondary | ICD-10-CM

## 2013-10-28 DIAGNOSIS — R519 Headache, unspecified: Secondary | ICD-10-CM

## 2013-10-28 DIAGNOSIS — IMO0002 Reserved for concepts with insufficient information to code with codable children: Secondary | ICD-10-CM | POA: Insufficient documentation

## 2013-10-28 DIAGNOSIS — Y929 Unspecified place or not applicable: Secondary | ICD-10-CM | POA: Insufficient documentation

## 2013-10-28 DIAGNOSIS — Y9389 Activity, other specified: Secondary | ICD-10-CM | POA: Insufficient documentation

## 2013-10-28 HISTORY — DX: Headache: R51

## 2013-10-28 HISTORY — DX: Headache, unspecified: R51.9

## 2013-10-28 MED ORDER — PROMETHAZINE HCL 25 MG PO TABS: 25.0000 mg | ORAL_TABLET | Freq: Four times a day (QID) | ORAL | Status: DC | PRN

## 2013-10-28 MED ORDER — METOCLOPRAMIDE HCL 10 MG PO TABS: 10.0000 mg | ORAL_TABLET | Freq: Four times a day (QID) | ORAL | Status: DC | PRN

## 2013-10-28 MED ORDER — DIPHENHYDRAMINE HCL 50 MG/ML IJ SOLN
50.0000 mg | Freq: Once | INTRAMUSCULAR | Status: AC
  Administered 2013-10-28: 50 mg via INTRAMUSCULAR
  Filled 2013-10-28: qty 1

## 2013-10-28 MED ORDER — PROMETHAZINE HCL 25 MG/ML IJ SOLN
25.0000 mg | Freq: Once | INTRAMUSCULAR | Status: AC
  Administered 2013-10-28: 25 mg via INTRAMUSCULAR
  Filled 2013-10-28 (×2): qty 1

## 2013-10-28 MED ORDER — KETOROLAC TROMETHAMINE 60 MG/2ML IM SOLN
60.0000 mg | Freq: Once | INTRAMUSCULAR | Status: AC
  Administered 2013-10-28: 60 mg via INTRAMUSCULAR
  Filled 2013-10-28: qty 2

## 2013-10-28 NOTE — ED Notes (Signed)
Pt c/o right sided head pain that started about a month ago after hitting her head on a metal bar while she was cleaning and stood up unaware of how close she was to it. Pt states they used to come about once a day but now they come about every 10 mins.

## 2013-10-28 NOTE — Discharge Instructions (Signed)
°Emergency Department Resource Guide °1) Find a Doctor and Pay Out of Pocket °Although you won't have to find out who is covered by your insurance plan, it is a good idea to ask around and get recommendations. You will then need to call the office and see if the doctor you have chosen will accept you as a new patient and what types of options they offer for patients who are self-pay. Some doctors offer discounts or will set up payment plans for their patients who do not have insurance, but you will need to ask so you aren't surprised when you get to your appointment. ° °2) Contact Your Local Health Department °Not all health departments have doctors that can see patients for sick visits, but many do, so it is worth a call to see if yours does. If you don't know where your local health department is, you can check in your phone book. The CDC also has a tool to help you locate your state's health department, and many state websites also have listings of all of their local health departments. ° °3) Find a Walk-in Clinic °If your illness is not likely to be very severe or complicated, you may want to try a walk in clinic. These are popping up all over the country in pharmacies, drugstores, and shopping centers. They're usually staffed by nurse practitioners or physician assistants that have been trained to treat common illnesses and complaints. They're usually fairly quick and inexpensive. However, if you have serious medical issues or chronic medical problems, these are probably not your best option. ° °No Primary Care Doctor: °- Call [facility] at  832-8000 - they can help you locate a primary care doctor that  accepts your insurance, provides certain services, etc. °- Physician Referral Service- 1-800-533-3463 ° °Chronic Pain Problems: °Organization         Address  Phone   Notes  °[facility]  (336) 297-2271 Patients need to be referred by their primary care doctor.  ° °Medication  Assistance: °Organization         Address  Phone   Notes  °[place] [facility] [address] °[place], [place] [address] (336) 641-8030 --Must be a resident of [place] °-- Must have NO insurance coverage whatsoever (no Medicaid/ Medicare, etc.) °-- The pt. MUST have a primary care doctor that directs their care regularly and follows them in the community °  °[facility]  (866) 331-1348   °[employer]  (888) 892-1162   ° °Agencies that provide inexpensive medical care: °Organization         Address  Phone   Notes  °[facility]  (336) 832-8035   °[facility]    (336) 832-7272   °[facility] [address] °[place], [place] [address] (336) 832-4777   °[facility] [address], °[place] (336) 271-4999   °[facility]    (336) 373-0678   °[facility]    (336) 272-1050   °[facility] ° [address], [place] Phone:  (336) 832-4444, Fax:  (336) 832-4440 Hours of Operation:  9 am - 6 pm, M-F.  Also accepts Medicaid/Medicare and self-pay.  °[facility] ° [address], [place] Phone: (336) 832-3150, Fax: (336) 832-3151. Hours of Operation:  8:30 am - 5:30 pm, M-F.  Also accepts Medicaid and self-pay.  °[facility] [place] [address], [place] Phone: (336) 878-6027   °[facility] [address], [place], [place] (336)723-1848, Ext. 123 Mondays & Thursdays: 7-9 AM.  First 15 patients are seen on a first come, first serve basis. °  ° °Medicaid-accepting [place] Providers: ° °Organization         Address  Phone   Notes  °[facility] [address], [place] (336) 641-2100 Also accepts self-pay patients.  °[facility] [address], [place] ° (336) 856-9996   °[facility] [address], [place]  (336) 288-8857   °[facility] [address] [place] [address], [place] (336) 299-7000   °Veita Bland [address], [place]  ° (336) 373-1557 Only accepts [place] Medicaid patients after they have their name applied to their card.  ° °Self-Pay (no insurance) in [place]: ° °Organization         Address  Phone   Notes  °Sickle Cell Patients, [facility] [address], [place] (336) 832-1970   °[facility] [address], [place] (336) 832-4400   °[facility] ° [address], [place] (336) 992-4800   °[facility]/Dr. Osei-Bonsu ° [address] [place] [address], [place] or [address], [place] (336) [phone] Phone number for both [place] and [place] locations is the same.  °[facility] [address], [place] (336) 299-0000   °[facility] [address] [place] [address], [place] or [address] (336) 852-7530 °(336) 878-2260   °[facility] [address], [place] (336) 350-1642, phone; (336) 294-5005, fax Sees patients 1st and 3rd Saturday of every month.  Must not qualify for public or private insurance (i.e. Medicaid, Medicare, [place] Health Choice, Veterans' Benefits) • Household income should be no more than 200% of the poverty level •The clinic cannot treat you if you are pregnant or think you are pregnant • Sexually transmitted diseases are not treated at the clinic.  ° ° °Dental Care: °Organization         Address  Phone  Notes  °[place] [facility] [address], [place] (336) 641-6152 Accepts children up to age 21 who are enrolled in Medicaid or [place] Health Choice; pregnant women with a Medicaid card; and children who have applied for Medicaid or [place] Health Choice, but were declined, whose parents can pay a reduced fee at time of service.  °[place]  [facility] [place]  [address], [place] (336) 641-7733 Accepts children up to age 21 who are enrolled in Medicaid or [place] Health Choice; pregnant women with a Medicaid card; and children who have applied for Medicaid or  Health Choice, but were declined, whose parents can pay a reduced fee at time of service.  °[facility] ° [address], [place] (336) 641-4533 Patients are seen by appointment only. Walk-ins are not accepted. [facility] will see patients 18 years of age and older. °Monday - Tuesday (8am-5pm) °Most Wednesdays (8:30-5pm) °$30 per visit, cash only  °[facility] ° [address], [place] (336) 641-4533 Patients are seen by appointment only. Walk-ins are not accepted. [facility] will see patients 18 years of age and older. °One   Wednesday Evening (Monthly: Volunteer Based).  $30 per visit, cash only  °[facility]  (919) 537-3737 for adults; Children under age 4, call Graduate Pediatric Dentistry at ([phone]) [phone]. Children aged 4-14, please call (919) [phone] to request a pediatric application. ° Dental services are provided in all areas of dental care including fillings, crowns and bridges, complete and partial dentures, implants, gum treatment, root canals, and extractions. Preventive care is also provided. Treatment is provided to both adults and children. °Patients are selected via a lottery and there is often a waiting list. °  °[facility] [address], °[place] ° (336) 763-8833 [email or web address] °  °[facility] [address], [place], [place] (336)723-1848, Ext. 123 Second and Fourth Thursday of each month, opens at 6:30 AM; Clinic ends at 9 AM.  Patients are seen on a first-come first-served basis, and a limited number are seen during each clinic.  ° °[facility] ° [address], [place], [place] (336) 723-7904    Eligibility Requirements °You must have lived in [place], [place], or [place] counties for at least the last three months. °  You cannot be eligible for state or federal sponsored healthcare insurance, including Veterans Administration, Medicaid, or Medicare. °  You generally cannot be eligible for healthcare insurance through your employer.  °  How to apply: °Eligibility screenings are held every Tuesday and Wednesday afternoon from 1:00 pm until 4:00 pm. You do not need an appointment for the interview!  °[facility] [address], [place], [place] 336-631-2330   °[facility]  336-342-8273   °[facility]  336-703-3100   ° [facility]  336-570-6415   ° °Behavioral Health Resources in the Community: °Intensive Outpatient Programs °Organization         Address  Phone  Notes  °[facility] [address], [place], [place] 336-878-6098   °[facility] [address], [place], [place] 336-832-9800   °[facility] [address], [place], [place] ° 336-882-2125   °[facility] [address],  °[place], [place] 1-800-853-5163 or 336-641-4981   °Substance Abuse Resources °Organization         Address  Phone  Notes  °[facility]  336-882-2125   °[facility]  336-784-9470   °[facility]  336-285-9073   °[facility]  336-845-3988   °Residential & Outpatient Substance Abuse Program  1-800-659-3381   °Psychological Services °Organization         Address  Phone  Notes  °[facility]  336- 832-9600   °[facility]  336- 378-7881   °[facility] [address], [place] 1-800-853-5163 or 336-641-4981   ° °Mobile Crisis Teams °Organization         Address  Phone  Notes  °[facility], Mobile Crisis Care Unit  1-877-626-1772   °Assertive °[facility] ° [address]  [place], [place] 336-834-9664   °Sharon DeEsch [address] °[place] 336-554-5454   ° °Self-Help/Support Groups °Organization         Address  Phone             Notes  °[facility] - variety of support groups  336- 373-1402 Call for more information  °Narcotics Anonymous (NA), [facility] [address], °[place]  2 meetings at this location  ° °  Residential Treatment Programs Organization         Address  Phone  Notes  [facility] [address],    [place]  1-(212)746-0312   [facility]  [address], [place] [address], [place], [place]   [place], [place] 360-340-6979 Admissions: 8am-3pm M-F  [facility] [place] [address]    [place], [place] 144-315-4008   [facility] [address] [place], [place], [place]   [facility] [address],  [place], [place]   [facility] Intensive Outpatient [address], [place], [place]   [facility] ([place] [address]  [place], [place] 1-412-427-7751 or 2390404615   [facility] [address] [place], [place] Accepts Medicaid  [facility] [address]  [place] 1-984 599 8593 Substance Abuse/Addiction Treatment   [facility]         Address  Phone  Notes  [facility]  858-488-4596   Domenic Schwab, PhD [address] [place], [place]   508-561-1319 or 680-733-8304   [place], [place] [phone]   [facility] [address], [place], [place] (930) 316-0004 Insurance/Medicaid/sponsorship through [facility] and Families [address]                                    [place], [place] (607)[phone] [place] [address], [place] [phone]    Dr. Adele Schilder  ([phone]) [phone]   [facility] Dept. 1) [address] [place] 2) [place] 3)  [address], [place] [phone]  [phone]   [place] [phone] or [phone] (After Hours)       Take over the counter tylenol, ibuprofen (OR excedrin) and benadryl, as directed on packaging, with the prescription given to you today, as needed for headache.  Keep a headache diary, as discussed.  Call your regular medical doctor today to schedule a follow up appointment within the next 2 days.  Return to the Emergency Department immediately sooner if worsening.

## 2013-10-28 NOTE — ED Provider Notes (Signed)
CSN: [id number]     Arrival date & time 10/28/13  1104 History   First MD Initiated Contact with Patient 10/28/13 1217     Chief Complaint  Patient presents with  . headache       HPI Pt was seen at 1230. Per pt, c/o gradual onset and persistence of constant "headache" for the past 1 month. Describes the headache as "throbbing," and "aching," located on her vertex and right scalp areas. States the headache began after she hit the top of her head on a metal bar "while I was bent over cleaning and stood up too fast." Denies headache was sudden or maximal in onset or at any time.  Denies visual changes, no focal motor weakness, no tingling/numbness in extremities, no fevers, no neck pain, no rash.      Past Medical History  Diagnosis Date  . Headache    Past Surgical History  Procedure Laterality Date  . Vaginal hysterectomy  JUNE 2010    [facility]  . Laparoscopic gastric banding  DEC. 8 2009  . Back surgery  2000, 2004. 2007    [facility]  . Knee arthroscopy  OCT 2008  . Carpal tunnel release      Both hands   Family History  Problem Relation Age of Onset  . Heart disease Paternal Grandmother   . Heart disease Paternal Grandfather   . Heart disease Father    History  Substance Use Topics  . Smoking status: Never Smoker   . Smokeless tobacco: Never Used  . Alcohol Use: Yes     Comment: 1 or 2 per week   OB History   Grav Para Term Preterm Abortions TAB SAB Ect Mult Living   2 1 1  1  1   1      Review of Systems ROS: Statement: All systems negative except as marked or noted in the HPI; Constitutional: Negative for fever and chills. ; ; Eyes: Negative for eye pain, redness and discharge. ; ; ENMT: Negative for ear pain, hoarseness, nasal congestion, sinus pressure and sore throat. ; ; Cardiovascular: Negative for chest pain, palpitations, diaphoresis, dyspnea and peripheral edema. ; ; Respiratory: Negative for cough, wheezing and stridor. ; ; Gastrointestinal: Negative for  nausea, vomiting, diarrhea, abdominal pain, blood in stool, hematemesis, jaundice and rectal bleeding. . ; ; Genitourinary: Negative for dysuria, flank pain and hematuria. ; ; Musculoskeletal: Negative for back pain and neck pain. Negative for swelling and trauma.; ; Skin: Negative for pruritus, rash, abrasions, blisters, bruising and skin lesion.; ; Neuro: +headache. Negative for lightheadedness and neck stiffness. Negative for weakness, altered level of consciousness , altered mental status, extremity weakness, paresthesias, involuntary movement, seizure and syncope.      Allergies  [place]raw[place]  Home Medications   Prior to Admission medications   Medication Sig Start Date End Date Taking? Authorizing Provider  diphenhydrAMINE (BENADRYL) 25 mg capsule Take 50 mg by mouth at bedtime as needed for sleep.   Yes Historical Provider, MD  ibuprofen (ADVIL,MOTRIN) 200 MG tablet Take 600 mg by mouth every 6 (six) hours as needed for headache.   Yes Historical Provider, MD  metroNIDAZOLE (METROGEL) 1 % gel Apply 1 application topically every morning. Applies to face for rosacea.   Yes Historical Provider, MD  promethazine (PHENERGAN) 25 MG tablet Take 1 tablet (25 mg total) by mouth every 6 (six) hours as needed for nausea or vomiting. 10/28/13   Alfonzo Feller, DO   BP 156/96  Pulse  81  Temp(Src) 98.7 F (37.1 C) (Oral)  Resp 20  SpO2 98% Physical Exam 1235: Physical examination:  Nursing notes reviewed; Vital signs and O2 SAT reviewed;  Constitutional: Well developed, Well nourished, Well hydrated, In no acute distress; Head:  Normocephalic, atraumatic; Eyes: EOMI, PERRL, No scleral icterus; ENMT: TM's clear bilat. +edemetous nasal turbinates bilat with clear rhinorrhea. Mouth and pharynx normal, Mucous membranes moist; Neck: Supple, Full range of motion, No lymphadenopathy; Cardiovascular: Regular rate and rhythm, No murmur, rub, or gallop; Respiratory: Breath sounds clear & equal bilaterally,  No rales, rhonchi, wheezes.  Speaking full sentences with ease, Normal respiratory effort/excursion; Chest: Nontender, Movement normal; Abdomen: Soft, Nontender, Nondistended, Normal bowel sounds; Genitourinary: No CVA tenderness; Extremities: Pulses normal, No tenderness, No edema, No calf edema or asymmetry.; Neuro: AA&Ox3, Major CN grossly intact.Speech clear.  No facial droop.  No nystagmus. Grips equal. Strength 5/5 equal bilat UE's and LE's.  DTR 2/4 equal bilat UE's and LE's.  No gross sensory deficits.  Normal cerebellar testing bilat UE's (finger-nose) and LE's (heel-shin)..; Skin: Color normal, Warm, Dry.   ED Course  Procedures     EKG Interpretation None      MDM  MDM Reviewed: previous chart, nursing note and vitals Interpretation: CT scan     Ct Head Wo Contrast 10/28/2013   CLINICAL DATA:  Headache.  Head trauma 1 month ago.  EXAM: CT HEAD WITHOUT CONTRAST  TECHNIQUE: Contiguous axial images were obtained from the base of the skull through the vertex without intravenous contrast.  COMPARISON:  MRI 02/14/2008  FINDINGS: Ventricle size is normal. Negative for acute or chronic infarction. Negative for hemorrhage or fluid collection. Negative for mass or edema. No shift of the midline structures.  Calvarium is intact.  IMPRESSION: Negative   Electronically Signed   By: Franchot Gallo M.D.   On: 10/28/2013 13:07    1400:  Feels better after meds and wants to go home now. Will tx symptomatically at this time. Dx and testing d/w pt and family.  Questions answered.  Verb understanding, agreeable to d/c home with outpt f/u.   Alfonzo Feller, DO 10/31/13 1019

## 2013-10-30 ENCOUNTER — Ambulatory Visit: Payer: BC Managed Care – PPO | Admitting: Gynecology

## 2013-10-30 ENCOUNTER — Other Ambulatory Visit: Payer: BC Managed Care – PPO

## 2013-10-31 ENCOUNTER — Ambulatory Visit (INDEPENDENT_AMBULATORY_CARE_PROVIDER_SITE_OTHER): Payer: BC Managed Care – PPO | Admitting: Physician Assistant

## 2013-10-31 ENCOUNTER — Ambulatory Visit
Admission: RE | Admit: 2013-10-31 | Discharge: 2013-10-31 | Disposition: A | Discharge status: Home / Self Care | Payer: BC Managed Care – PPO | Source: Ambulatory Visit | Attending: Physician Assistant | Admitting: Physician Assistant

## 2013-10-31 ENCOUNTER — Ambulatory Visit
Admission: RE | Admit: 2013-10-31 | Discharge: 2013-10-31 | Disposition: A | Discharge status: Home / Self Care | Payer: BC Managed Care – PPO | Source: Ambulatory Visit

## 2013-10-31 ENCOUNTER — Encounter (INDEPENDENT_AMBULATORY_CARE_PROVIDER_SITE_OTHER): Payer: Self-pay

## 2013-10-31 ENCOUNTER — Other Ambulatory Visit: Payer: Self-pay

## 2013-10-31 VITALS — BP 128/80 | HR 84 | Temp 98.4°F | Ht 68.0 in | Wt 289.4 lb

## 2013-10-31 DIAGNOSIS — R1310 Dysphagia, unspecified: Secondary | ICD-10-CM

## 2013-10-31 DIAGNOSIS — Z4651 Encounter for fitting and adjustment of gastric lap band: Secondary | ICD-10-CM

## 2013-10-31 DIAGNOSIS — Z1231 Encounter for screening mammogram for malignant neoplasm of breast: Secondary | ICD-10-CM

## 2013-10-31 DIAGNOSIS — Z9884 Bariatric surgery status: Secondary | ICD-10-CM | POA: Insufficient documentation

## 2013-10-31 NOTE — Progress Notes (Signed)
  HISTORY: [name] is a 43 y.o.female who received an AP-Standard lap-band in December 2009 by Dr. Excell Seltzer. She comes in with 5 lbs weight gain since her last visit in August 2012. She gives a history of persistent solid food dysphagia since her last visit when some fluid was removed. She has about 2 episodes a month of trouble with liquids, which resolves within a couple of days. She lacks energy at the end of the day and as a result does not engage in any exercise.  VITAL SIGNS: Filed Vitals:   10/31/13 1444  BP: 128/80  Pulse: 84  Temp: 98.4 F (36.9 C)    PHYSICAL EXAM: Physical exam reveals a very well-appearing 43 y.o.female in no apparent distress Neurologic: Awake, alert, oriented Psych: Bright affect, conversant Respiratory: Breathing even and unlabored. No stridor or wheezing Abdomen: Soft, nontender, nondistended to palpation. Incisions well-healed. No incisional hernias. Port easily palpated. Extremities: Atraumatic, good range of motion.  ASSESMENT: 43 y.o.  female  s/p AP-Standard lap-band.   PLAN: The patient's port was accessed with a 20G Huber needle without difficulty. Clear fluid was aspirated and 6 mL saline was removed from the port to give a total predicted volume of 0 mL. I gave her some water to drink which she said went down very easily as compared to previous. I've ordered a KUB to evaluate band position as she's had persistent dysphagia for quite a while. She has an appointment with Dr. Excell Seltzer in the coming weeks which I've asked her to attend. The patient was advised to concentrate on healthy food choices and to avoid slider foods high in fats and carbohydrates.

## 2013-10-31 NOTE — Patient Instructions (Signed)
Return to see Dr. Excell Seltzer as scheduled. Focus on good food choices as well as physical activity. Return sooner if you have an increase in hunger, portion sizes or weight. Return also for difficulty swallowing, night cough, reflux.

## 2013-11-04 ENCOUNTER — Encounter: Payer: Self-pay | Admitting: Neurology

## 2013-11-04 ENCOUNTER — Ambulatory Visit (INDEPENDENT_AMBULATORY_CARE_PROVIDER_SITE_OTHER): Payer: BC Managed Care – PPO | Admitting: Neurology

## 2013-11-04 VITALS — BP 119/82 | HR 90 | Ht 68.5 in | Wt 293.0 lb

## 2013-11-04 DIAGNOSIS — G43019 Migraine without aura, intractable, without status migrainosus: Secondary | ICD-10-CM

## 2013-11-04 HISTORY — DX: Migraine without aura, intractable, without status migrainosus: G43.019

## 2013-11-04 MED ORDER — PREDNISONE 5 MG PO TABS: ORAL_TABLET | ORAL | Status: DC

## 2013-11-04 MED ORDER — TOPIRAMATE 25 MG PO TABS: ORAL_TABLET | ORAL | Status: DC

## 2013-11-04 NOTE — Progress Notes (Signed)
Reason for visit: Headache  [name] is a 43 y.o. female  History of present illness:  [name] is a 43 year old right-handed white female with a history of migraine headaches since her mid 70s. The patient indicates that after she became pregnant at age 80, her headaches disappeared essentially, having only an occasional headache, on average once a year. The headaches usually are generalized in nature, lasting up to 4 hours, unassociated with visual changes or nausea or vomiting. The patient reports a throbbing discomfort associated with photophobia and phonophobia. One month ago, the patient bumped her head when she stooped over and then came up and struck the top of her head. The patient has had a flurry of headaches since that time, with her typical migraines occurring at least once a week, always on a Sunday evening. The headaches typically begin during sleep. The patient also describes episodes of sharp jabbing pain lasting 2-3 seconds in the right temporal area that are occurring up to 30 times a day, and occurred nighttime, waking her up in the evening hours. The brief episodes of pain or disabling. She denies any focal numbness or weakness of the extremities, or balance issues, or problems controlling the bowels or the bladder. She has undergone a CT scan of brain done through the emergency room that was unremarkable. She is sent to this office for an evaluation. Currently, she takes Motrin for her headaches, with some benefit. In the past, she did not tolerate Imitrex, as it resulted in increased heart rate.  Past Medical History  Diagnosis Date  . Headache   . Migraine without aura, with intractable migraine, so stated, without mention of status migrainosus 11/04/2013  . Obesity     Past Surgical History  Procedure Laterality Date  . Vaginal hysterectomy  JUNE 2010    [facility]  . Laparoscopic gastric banding  DEC. 8 2009  . Back surgery  2000, 2004. 2007    [facility]  .  Knee arthroscopy  OCT 2008    left  . Carpal tunnel release      Both hands  . Tonsillectomy      Family History  Problem Relation Age of Onset  . Heart disease Paternal Grandmother   . Heart disease Paternal Grandfather   . Heart disease Father   . Migraines Neg Hx     Social history:  reports that she has never smoked. She has never used smokeless tobacco. She reports that she drinks alcohol. She reports that she does not use illicit drugs.  Medications:  Current Outpatient Prescriptions on File Prior to Visit  Medication Sig Dispense Refill  . diphenhydrAMINE (BENADRYL) 25 mg capsule Take 50 mg by mouth at bedtime as needed for sleep.      Marland Kitchen ibuprofen (ADVIL,MOTRIN) 200 MG tablet Take 600 mg by mouth every 6 (six) hours as needed for headache.      . metroNIDAZOLE (METROGEL) 1 % gel Apply 1 application topically every morning. Applies to face for rosacea.       No current facility-administered medications on file prior to visit.      Allergies  Allergen Reactions  . Imitrex [Sumatriptan]     Increased heart rate  . Strawberry Hives    ROS:  Out of a complete 14 system review of symptoms, the patient complains only of the following symptoms, and all other reviewed systems are negative.  Fatigue Ringing in the ears Headache Sleepiness  Blood pressure 119/82, pulse 90, height  5' 8.5" (1.74 m), weight 293 lb (132.904 kg).  Physical Exam  General: The patient is alert and cooperative at the time of the examination. The patient is markedly obese.  Eyes: Pupils are equal, round, and reactive to light. Discs are flat bilaterally.  Neck: The neck is supple, no carotid bruits are noted.  Respiratory: The respiratory examination is clear.  Cardiovascular: The cardiovascular examination reveals a regular rate and rhythm, no obvious murmurs or rubs are noted.  Skin: Extremities are without significant edema.  Neurologic Exam  Mental status: The patient is alert and  oriented x 3 at the time of the examination. The patient has apparent normal recent and remote memory, with an apparently normal attention span and concentration ability.  Cranial nerves: Facial symmetry is present. There is good sensation of the face to pinprick and soft touch bilaterally. The strength of the facial muscles and the muscles to head turning and shoulder shrug are normal bilaterally. Speech is well enunciated, no aphasia or dysarthria is noted. Extraocular movements are full. Visual fields are full. The tongue is midline, and the patient has symmetric elevation of the soft palate. No obvious hearing deficits are noted.  Motor: The motor testing reveals 5 over 5 strength of all 4 extremities. Good symmetric motor tone is noted throughout.  Sensory: Sensory testing is intact to pinprick, soft touch, vibration sensation, and position sense on all 4 extremities. No evidence of extinction is noted.  Coordination: Cerebellar testing reveals good finger-nose-finger and heel-to-shin bilaterally.  Gait and station: Gait is normal. Tandem gait is normal. Romberg is negative. No drift is seen.  Reflexes: Deep tendon reflexes are symmetric, but are depressed bilaterally. Toes are downgoing bilaterally.   CT head 10/28/2013:  IMPRESSION:  Negative    Assessment/Plan:  1. Migraine headache  2. 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 The patient will be started on Topamax, and she will be given a prednisone Dosepak. The patient will followup in about 3 months, if the headaches come under better control, the Topamax can be stopped. We may give a trial on Indocin in the future or the ice take pains if the Topamax is not effective.  Rose Alexanders MD 11/04/2013 10:34 AM  [facility] [address] [place], [place] [address]  Phone  [phone] Fax [phone]

## 2013-11-04 NOTE — Patient Instructions (Signed)
Migraine Headache A migraine headache is an intense, throbbing pain on one or both sides of your head. A migraine can last for 30 minutes to several hours. CAUSES  The exact cause of a migraine headache is not always known. However, a migraine may be caused when nerves in the brain become irritated and release chemicals that cause inflammation. This causes pain. Certain things may also trigger migraines, such as:  Alcohol.  Smoking.  Stress.  Menstruation.  Aged cheeses.  Foods or drinks that contain nitrates, glutamate, aspartame, or tyramine.  Lack of sleep.  Chocolate.  Caffeine.  Hunger.  Physical exertion.  Fatigue.  Medicines used to treat chest pain (nitroglycerine), birth control pills, estrogen, and some blood pressure medicines. SIGNS AND SYMPTOMS  Pain on one or both sides of your head.  Pulsating or throbbing pain.  Severe pain that prevents daily activities.  Pain that is aggravated by any physical activity.  Nausea, vomiting, or both.  Dizziness.  Pain with exposure to bright lights, loud noises, or activity.  General sensitivity to bright lights, loud noises, or smells. Before you get a migraine, you may get warning signs that a migraine is coming (aura). An aura may include:  Seeing flashing lights.  Seeing bright spots, halos, or zig-zag lines.  Having tunnel vision or blurred vision.  Having feelings of numbness or tingling.  Having trouble talking.  Having muscle weakness. DIAGNOSIS  A migraine headache is often diagnosed based on:  Symptoms.  Physical exam.  A CT scan or MRI of your head. These imaging tests cannot diagnose migraines, but they can help rule out other causes of headaches. TREATMENT Medicines may be given for pain and nausea. Medicines can also be given to help prevent recurrent migraines.  HOME CARE INSTRUCTIONS  Only take over-the-counter or prescription medicines for pain or discomfort as directed by your  health care provider. The use of long-term narcotics is not recommended.  Lie down in a dark, quiet room when you have a migraine.  Keep a journal to find out what may trigger your migraine headaches. For example, write down:  What you eat and drink.  How much sleep you get.  Any change to your diet or medicines.  Limit alcohol consumption.  Quit smoking if you smoke.  Get 7 9 hours of sleep, or as recommended by your health care provider.  Limit stress.  Keep lights dim if bright lights bother you and make your migraines worse. SEEK IMMEDIATE MEDICAL CARE IF:   Your migraine becomes severe.  You have a fever.  You have a stiff neck.  You have vision loss.  You have muscular weakness or loss of muscle control.  You start losing your balance or have trouble walking.  You feel faint or pass out.  You have severe symptoms that are different from your first symptoms. MAKE SURE YOU:   Understand these instructions.  Will watch your condition.  Will get help right away if you are not doing well or get worse. Document Released: 06/06/2005 Document Revised: 03/27/2013 Document Reviewed: 02/11/2013 [employer] Patient Information 2014 [employer].  

## 2013-11-07 ENCOUNTER — Telehealth: Payer: Self-pay | Admitting: Neurology

## 2013-11-07 ENCOUNTER — Encounter: Payer: Self-pay | Admitting: Neurology

## 2013-11-07 NOTE — Telephone Encounter (Signed)
I called patient, left a message. I will call back later. 

## 2013-11-07 NOTE — Telephone Encounter (Signed)
I called patient. The patient started her Topamax on the 18th, along with prednisone Dosepak. The headaches have continued until today, the headaches are better now. The patient wants to take several days off from work to allow the medication to try to improve the headache. I will write a note to keep her out of work from this date until June 1.

## 2013-11-07 NOTE — Telephone Encounter (Signed)
Spoke with patient and she said that the topiramate is not helping, has been taking since 05/18.  The icepic headaches have not decreased. Would it be possible to write her out of work, is not able to function. Headaches are so severe when having one, is afraid to drive for fear of having a wreck

## 2013-11-07 NOTE — Telephone Encounter (Signed)
Wants to be seen states she is still having headaches.

## 2013-11-12 ENCOUNTER — Telehealth: Payer: Self-pay | Admitting: Neurology

## 2013-11-12 ENCOUNTER — Encounter: Payer: Self-pay | Admitting: Neurology

## 2013-11-12 NOTE — Telephone Encounter (Signed)
Patient returning Cathy's call.  Thanks

## 2013-11-12 NOTE — Telephone Encounter (Signed)
Pt's letter was already faxed over to pt's job today, per Dr. Jannifer Franklin.

## 2013-11-14 MED ORDER — INDOMETHACIN 50 MG PO CAPS: 50.0000 mg | ORAL_CAPSULE | Freq: Two times a day (BID) | ORAL | Status: DC

## 2013-11-14 NOTE — Telephone Encounter (Signed)
I called patient. The patient is having ongoing ice pick pains and migraine. I will give her a two-week trial on indomethacin taking 50 mg twice daily. The patient has not yet gotten up to 75 mg daily of the Topamax. The patient should give the medication a fair trial.

## 2013-11-14 NOTE — Telephone Encounter (Signed)
Patient calling to check status on date corrections on letter.  She's also requesting earlier appointment due to migraines have worsened.  Please call and advise.

## 2013-11-14 NOTE — Telephone Encounter (Signed)
Called pt to inform her that Dr. Jannifer Franklin wrote her letter and faxed it himself. Pt stated that she is still having migraines and that the medication Topiramate is not helping and wanted to know if she needs to make an appt to come in. Pt has a f/u with Jinny Blossom, NP on 02/11/14. Please advise

## 2013-11-21 ENCOUNTER — Telehealth: Payer: Self-pay | Admitting: Neurology

## 2013-11-21 NOTE — Telephone Encounter (Signed)
Patient requesting a note extending time off of work (suppose to return on Monday).  She forgot to pick up new Rx for indomethacin (INDOCIN) 50 MG capsule, so therefore called and stated topiramate (TOPAMAX) 25 MG tablet wasn't easing the ice pick headaches.   I reminded her of telephone conversation with Dr Jannifer Franklin on 5/28.  She  stated would pick up new Rx today, but still need note.

## 2013-11-21 NOTE — Telephone Encounter (Signed)
I called in a prescription for the indomethacin one week ago. I think the fact that the patient has not yet picked up the medication suggests that the headache pain issues not significant. I will not write a letter to extend her leave of absence from work.

## 2013-11-21 NOTE — Telephone Encounter (Signed)
Patient stated still experiencing 10 to 15 ice pick headaches a day.  Questioning  If medication could be changed from topiramate (TOPAMAX) 25 MG tablet.  Please call and advise

## 2013-11-21 NOTE — Telephone Encounter (Signed)
Spoke with patient and she wanted the out of work letter extended for one more week(return back to work on 12/02/13 instead of 11/25/13). She is not ready to return back to work just yet, still having 10 to 15 headaches daily. She is going to pick up her prescription (indomethacin) today.

## 2013-11-25 ENCOUNTER — Telehealth: Payer: Self-pay | Admitting: Neurology

## 2013-11-26 ENCOUNTER — Telehealth: Payer: Self-pay | Admitting: *Deleted

## 2013-11-26 NOTE — Telephone Encounter (Signed)
Spoke to patient and she relayed that the dates of being off were incorrect.  The doctors note was changed from 11-18-13 to return to work on 11-25-13, also she actually worked the week of 11-04-13 through 11-08-13.  She was was out of work from 11-11-13 through 11-25-13.  She asked if she should go back to work today and I advised that would be best.  The pain is still on one side of her head and she said either the Topamax or the Indocin is making her very forgetful.  Not sure what she should do.  Patient became very tearful and stated she will not get paid if dates aren't fixed and that she is still experiencing pain.

## 2013-11-26 NOTE — Telephone Encounter (Signed)
Left message that the doctor has only put her out of work until 11-18-13.  No extension is granted.  Also paper work for the [employer] is completed, stating her restrictions are from 11-04-13 through 11-18-13.

## 2013-11-27 ENCOUNTER — Ambulatory Visit (INDEPENDENT_AMBULATORY_CARE_PROVIDER_SITE_OTHER): Payer: BC Managed Care – PPO | Admitting: General Surgery

## 2013-11-27 ENCOUNTER — Encounter (INDEPENDENT_AMBULATORY_CARE_PROVIDER_SITE_OTHER): Payer: Self-pay | Admitting: General Surgery

## 2013-11-27 NOTE — Progress Notes (Signed)
Chief complaint: Followup lap band and obesity  History: The patient returns for followup of her lap band placed in December 2009. She had initial reasonably good success using about 50 pounds in the first year. She then however disappeared to followup for the last several years after being seen in 2012 with some weight regain up to total weight loss of 24 pounds. A fill was done in 2011 and she thinks a little bit removed in 2012 although this is not documented. At any rate she states that for about 2 years she has had worsening dysphagia to where she will have frequent regurgitation and really not eating any solid food and getting by on about 2 protein shakes a day. This resulted in lack of energy and inability to exercise. Her weight is essentially unchanged during that period of time.  She saw Jonni Sanger 3 weeks ago and all fluid was removed. She states she is now able to eat well although she still feels a little bit of restriction. Her energy has returned. She wants to try to see what she can do with diet and exercise with her current level of restriction.  Exam: BP 122/78  Pulse 84  Temp(Src) 97.5 F (36.4 C) (Temporal)  Resp 14  Ht 5' 8.5" (1.74 m)  Wt 293 lb 6.4 oz (133.085 kg)  BMI 43.96 kg/m2 Total weight loss of 16 pounds, upper 4 pounds from one month ago  General: Obese but otherwise well-appearing Caucasian female Abdomen: Soft nontender. Port site looks fine  KUB was obtained showing normal angle of her LAP-BAND in just a slightly more vertical position which would not indicate slip  Assessment and plan: Status post lap band with history of over restriction for a couple of years now all fluid out. She had initially fair weight loss but weight was regained actually during the period she felt she was over restricted. After discussion with her today as above she will try to up her exercise level down her energy is better. She has been drinking sweet tea and eating ice cream and we  discussed as an initial step to eliminate any sort of sweet liquids. She will try to fill up on solid foods. I think if she is to have any success she will need some fluid in her band and she seemed to do best with at about 4 cc in. She will return in 2 months.

## 2013-11-27 NOTE — Telephone Encounter (Signed)
The dates on the form were changed to have the patient out of work from 11/11/2013 until 11/25/2013.

## 2013-11-27 NOTE — Patient Instructions (Signed)
Work on gradually increasing exercise and avoiding any liquid caloric foods other than one protein shake per day

## 2013-11-28 DIAGNOSIS — Z0289 Encounter for other administrative examinations: Secondary | ICD-10-CM

## 2013-12-26 ENCOUNTER — Telehealth: Payer: Self-pay | Admitting: Neurology

## 2013-12-26 NOTE — Telephone Encounter (Signed)
This patient currently is being followed through the [facility] for her headache issues.

## 2014-01-29 ENCOUNTER — Encounter (INDEPENDENT_AMBULATORY_CARE_PROVIDER_SITE_OTHER): Payer: BC Managed Care – PPO | Admitting: General Surgery

## 2014-02-04 ENCOUNTER — Ambulatory Visit: Payer: BC Managed Care – PPO | Admitting: Adult Health

## 2014-02-11 ENCOUNTER — Ambulatory Visit: Payer: BC Managed Care – PPO | Admitting: Adult Health

## 2014-03-12 ENCOUNTER — Encounter (INDEPENDENT_AMBULATORY_CARE_PROVIDER_SITE_OTHER): Payer: BC Managed Care – PPO | Admitting: General Surgery

## 2014-04-21 ENCOUNTER — Encounter (INDEPENDENT_AMBULATORY_CARE_PROVIDER_SITE_OTHER): Payer: Self-pay | Admitting: General Surgery

## 2014-10-10 ENCOUNTER — Ambulatory Visit (INDEPENDENT_AMBULATORY_CARE_PROVIDER_SITE_OTHER): Payer: BLUE CROSS/BLUE SHIELD | Admitting: Gynecology

## 2014-10-10 ENCOUNTER — Encounter: Payer: Self-pay | Admitting: Gynecology

## 2014-10-10 VITALS — BP 126/84

## 2014-10-10 DIAGNOSIS — F329 Major depressive disorder, single episode, unspecified: Secondary | ICD-10-CM

## 2014-10-10 DIAGNOSIS — F32A Depression, unspecified: Secondary | ICD-10-CM

## 2014-10-10 MED ORDER — FLUOXETINE HCL 20 MG PO TABS: 20.0000 mg | ORAL_TABLET | Freq: Every day | ORAL | Status: DC

## 2014-10-10 NOTE — Progress Notes (Signed)
[name] 11-29-1970 [id number]        44 y.o.  G2P1011 presents complaining of depression. Patient is in the process of adopting 3 children all of whom have PTSD. She is trying to manage this and is not able to find them help due to insurance restrictions and programs refusing to see the children. She is finally at her wits end finding herself having difficulty sleeping, eating more and crying daily. She has gained some weight. Has no thoughts of hurting herself or others. Is able to perform her daily functions.  Past medical history,surgical history, problem list, medications, allergies, family history and social history were all reviewed and documented in the EPIC chart.  Directed ROS with pertinent positives and negatives documented in the history of present illness/assessment and plan.  Exam:  Filed Vitals:   10/10/14 0900  BP: 126/84   General appearance:  Tearful with normal affect, behavior and orientation   Assessment/Plan:  44 y.o. G2P1011 with situational depression related to her adoption difficulties and dealing with the children trying to find him help. Recommended that she initiate medication. Will start with fluoxetine 20 mg. Side effects and risks reviewed to include worsening depression and suicidal ideation. ASAP call precautions if she starts to feel worse. I also gave her Sonda Primes card and stressed the importance of calling and making an appointment for follow up. I think she definitely needs to be seen also by a mental health provider and she agrees with this and will call make an appointment. Almyra Free may also have some ideas as far as helping her find help for her children. I asked the patient to call me within the month to see how she is doing and decide if we need to make dose adjustment as far as the fluoxetine.    Anastasio Auerbach MD, 9:19 AM 10/10/2014

## 2014-10-10 NOTE — Patient Instructions (Signed)
Follow up with [name] as we discussed. Call me in follow up as far as the medication in one month. Call me sooner if you start to feel more depressed or anxious.

## 2014-10-15 ENCOUNTER — Ambulatory Visit: Payer: Self-pay | Admitting: Licensed Clinical Social Worker

## 2014-10-27 ENCOUNTER — Telehealth: Payer: Self-pay | Admitting: *Deleted

## 2014-10-27 NOTE — Telephone Encounter (Signed)
I really cannot do that. It should come from a mental health provider. Her counselor should be able to supply her with that or bump that up to whoever supervises the counselor.  I do not question the the legitimacy of the request but when it comes to disability or taking patients out for prolonged periods of time that really needs to come through the expertise of the area involved i.e. Orthopedics per bone issues, GYN for bleeding/fibroids etc.

## 2014-10-27 NOTE — Telephone Encounter (Signed)
Pt called to see if you are willing to write a note taking her out of work due her depression. (follow up from [facility] 10/10/14) Pt had her second appointment with therapist today she is [facility]. Pt said she feels after 2 visits with therapist it will help her. I asked patient how long she is requesting and she told me whatever you choose to give her, states her insurance will pay for her up to 13 weeks out of work. Please advise

## 2014-10-27 NOTE — Telephone Encounter (Signed)
Pt informed with the below note. 

## 2014-10-31 ENCOUNTER — Telehealth: Payer: Self-pay | Admitting: *Deleted

## 2014-10-31 NOTE — Telephone Encounter (Signed)
Per Dr.Fernandez verbal message pt will need to take 1/2 tablet of Prozac throughout the weekend to see if symptoms stop. I am going to route message to Dr.Fontaine as well so he is aware of this.

## 2014-10-31 NOTE — Telephone Encounter (Signed)
Dr.Fernandez please see the below

## 2014-10-31 NOTE — Telephone Encounter (Signed)
Pt was prescribed fluoxetine 20 mg on OV 10/10/14 states about 1 week ago she noticed joint pain and tremors in hands and shoulders. Recommendations? Please advise

## 2014-11-05 ENCOUNTER — Emergency Department (HOSPITAL_COMMUNITY): Payer: BLUE CROSS/BLUE SHIELD

## 2014-11-05 ENCOUNTER — Encounter (HOSPITAL_COMMUNITY): Payer: Self-pay | Admitting: Family Medicine

## 2014-11-05 ENCOUNTER — Emergency Department (HOSPITAL_COMMUNITY)
Admission: EM | Admit: 2014-11-05 | Discharge: 2014-11-05 | Disposition: A | Discharge status: Home / Self Care | Payer: BLUE CROSS/BLUE SHIELD | Attending: Emergency Medicine | Admitting: Emergency Medicine

## 2014-11-05 DIAGNOSIS — Z79899 Other long term (current) drug therapy: Secondary | ICD-10-CM | POA: Diagnosis not present

## 2014-11-05 DIAGNOSIS — M546 Pain in thoracic spine: Secondary | ICD-10-CM | POA: Insufficient documentation

## 2014-11-05 DIAGNOSIS — R079 Chest pain, unspecified: Secondary | ICD-10-CM | POA: Insufficient documentation

## 2014-11-05 DIAGNOSIS — Z8679 Personal history of other diseases of the circulatory system: Secondary | ICD-10-CM | POA: Insufficient documentation

## 2014-11-05 DIAGNOSIS — E669 Obesity, unspecified: Secondary | ICD-10-CM | POA: Insufficient documentation

## 2014-11-05 DIAGNOSIS — M549 Dorsalgia, unspecified: Secondary | ICD-10-CM | POA: Diagnosis present

## 2014-11-05 LAB — I-STAT TROPONIN, ED: Troponin i, poc: 0 ng/mL (ref 0.00–0.08)

## 2014-11-05 LAB — BASIC METABOLIC PANEL
Anion gap: 6 (ref 5–15)
BUN: 7 mg/dL (ref 6–20)
CO2: 27 mmol/L (ref 22–32)
Calcium: 9.1 mg/dL (ref 8.9–10.3)
Chloride: 104 mmol/L (ref 101–111)
Creatinine, Ser: 0.81 mg/dL (ref 0.44–1.00)
GFR calc Af Amer: >60 mL/min (ref >60)
GFR calc non Af Amer: >60 mL/min (ref >60)
Glucose, Bld: 112 mg/dL — ABNORMAL HIGH (ref 65–99)
Potassium: 3.6 mmol/L (ref 3.5–5.1)
Sodium: 137 mmol/L (ref 135–145)

## 2014-11-05 LAB — CBC
HCT: 40.7 % (ref 36.0–46.0)
Hemoglobin: 13.4 g/dL (ref 12.0–15.0)
MCH: 29.1 pg (ref 26.0–34.0)
MCHC: 32.9 g/dL (ref 30.0–36.0)
MCV: 88.5 fL (ref 78.0–100.0)
Platelets: 289 10*3/uL (ref 150–400)
RBC: 4.6 MIL/uL (ref 3.87–5.11)
RDW: 13.5 % (ref 11.5–15.5)
WBC: 6.5 10*3/uL (ref 4.0–10.5)

## 2014-11-05 MED ORDER — CYCLOBENZAPRINE HCL 5 MG PO TABS: 5.0000 mg | ORAL_TABLET | Freq: Three times a day (TID) | ORAL | Status: DC | PRN

## 2014-11-05 NOTE — Telephone Encounter (Signed)
I would stay off the medication for a week or so. Assuming her symptoms totally resolve call and we can consider trying a different antidepressant. If her symptoms would persist then I would suggest following up with her primary physician to rule out other causes for these symptoms.

## 2014-11-05 NOTE — Telephone Encounter (Signed)
Dr.Fontaine I called to check patient regarding taking half of the Prozac and pt said she stopped medication on Monday because symptoms were still there. Please advise

## 2014-11-05 NOTE — ED Notes (Signed)
Pt having chest pain radiating into her back. sts intermittent and today worse. Denies SOB, cough, fever. sts worse with breathing.

## 2014-11-05 NOTE — ED Provider Notes (Signed)
CSN: [id number]     Arrival date & time 11/05/14  4098 History   First MD Initiated Contact with Patient 11/05/14 0912     Chief Complaint  Patient presents with  . Chest Pain  . Back Pain     (Consider location/radiation/quality/duration/timing/severity/associated sxs/prior Treatment) Patient is a 44 y.o. female presenting with chest pain and back pain.  Chest Pain Pain location:  L chest Pain quality: sharp   Pain radiates to:  Upper back Pain severity:  Moderate Onset quality:  Gradual Duration:  4 days Timing:  Intermittent Progression:  Worsening Chronicity:  New (although, had similar symptoms on the other side of her chest/back recently) Context comment:  Pt cannot recall any instigating factors. Relieved by:  Rest Worsened by:  Deep breathing Associated symptoms: back pain   Associated symptoms: no cough, no diaphoresis, no fever, no nausea, no shortness of breath and not vomiting   Back Pain Associated symptoms: chest pain   Associated symptoms: no fever     Past Medical History  Diagnosis Date  . Headache   . Migraine without aura, with intractable migraine, so stated, without mention of status migrainosus 11/04/2013  . Obesity    Past Surgical History  Procedure Laterality Date  . Vaginal hysterectomy  JUNE 2010    [facility]  . Laparoscopic gastric banding  DEC. 8 2009  . Back surgery  2000, 2004. 2007    [place]  . Knee arthroscopy  OCT 2008    left  . Carpal tunnel release      Both hands  . Tonsillectomy     Family History  Problem Relation Age of Onset  . Heart disease Paternal Grandmother   . Heart disease Paternal Grandfather   . Heart disease Father   . Migraines Neg Hx    History  Substance Use Topics  . Smoking status: Never Smoker   . Smokeless tobacco: Never Used  . Alcohol Use: Yes     Comment: 1 or 2 per week   OB History    Gravida Para Term Preterm AB TAB SAB Ectopic Multiple Living   2 1 1  1  1   1      Review of Systems    Constitutional: Negative for fever and diaphoresis.  Respiratory: Negative for cough and shortness of breath.   Cardiovascular: Positive for chest pain.  Gastrointestinal: Negative for nausea and vomiting.  Musculoskeletal: Positive for back pain.  All other systems reviewed and are negative.     Allergies  Imitrex and Strawberry  Home Medications   Prior to Admission medications   Medication Sig Start Date End Date Taking? Authorizing Provider  diphenhydrAMINE (BENADRYL) 25 mg capsule Take 50 mg by mouth at bedtime as needed for sleep.   Yes Historical Provider, MD  FLUoxetine (PROZAC) 20 MG tablet Take 1 tablet (20 mg total) by mouth daily. 10/10/14   Anastasio Auerbach, MD   There were no vitals taken for this visit. Physical Exam  Constitutional: She is oriented to person, place, and time. She appears well-developed and well-nourished. No distress.  HENT:  Head: Normocephalic and atraumatic.  Mouth/Throat: Oropharynx is clear and moist.  Eyes: Conjunctivae are normal. Pupils are equal, round, and reactive to light. No scleral icterus.  Neck: Neck supple.  Cardiovascular: Normal rate, regular rhythm, normal heart sounds and intact distal pulses.   No murmur heard. Pulmonary/Chest: Effort normal and breath sounds normal. No stridor. No respiratory distress. She has no wheezes. She has  no rales. She exhibits no tenderness.  Abdominal: Soft. Bowel sounds are normal. She exhibits no distension. There is no tenderness.  Musculoskeletal: Normal range of motion.       Back:  Neurological: She is alert and oriented to person, place, and time.  Skin: Skin is warm and dry. No rash noted.  Psychiatric: She has a normal mood and affect. Her behavior is normal.  Nursing note and vitals reviewed.   ED Course  Procedures (including critical care time) Labs Review Labs Reviewed  BASIC METABOLIC PANEL - Abnormal; Notable for the following:    Glucose, Bld 112 (*)    All other  components within normal limits  CBC  I-STAT TROPOININ, ED    Imaging Review Dg Chest 2 View  11/05/2014   CLINICAL DATA:  Chest pain, back pain for 3 days  EXAM: CHEST  2 VIEW  COMPARISON:  03/05/2008  FINDINGS: Cardiomediastinal silhouette is unremarkable. No acute infiltrate or pleural effusion. No pulmonary edema. Mild degenerative changes lower thoracic spine.  IMPRESSION: No active cardiopulmonary disease.   Electronically Signed   By: Lahoma Crocker M.D.   On: 11/05/2014 09:43  All radiology studies independently viewed by me.      EKG Interpretation   Date/Time:  Wednesday Nov 05 2014 09:00:56 EDT Ventricular Rate:  102 PR Interval:  146 QRS Duration: 88 QT Interval:  344 QTC Calculation: 448 R Axis:   35 Text Interpretation:  Sinus tachycardia Cannot rule out Anterior infarct ,  age undetermined T wave abnormality, consider inferior ischemia Abnormal  ECG No significant change was found Confirmed by [facility]  MD, TREY ([id number])  on 11/05/2014 9:54:29 AM      MDM   Final diagnoses:  Left-sided thoracic back pain    44 yo female with left sided chest pain/back pain for past several days.  It went away yesterday, but came back today when she woke up.  Her history is atypical for ACS, PE, or dissection.  She is low risk for PE and PERC negative.  Exam shows severe tenderness to palpation of left back, reproducing symptoms.  I think this is the source of her pain.  I don't think she's had an aortic dissection (normal pulses, stable vitals, normal mediastinal contour, atypical story.)  History also inconsistent with ACS with unchanged EKG and negative troponin.  Plan to treat as MSK back pain.  She will follow up with PCP.  Return precautions were given.     Serita Grit, MD 11/05/14 1014

## 2014-11-05 NOTE — ED Notes (Signed)
Patient transported to X-ray 

## 2014-11-05 NOTE — Telephone Encounter (Signed)
Left message for pt to call.

## 2014-11-05 NOTE — Discharge Instructions (Signed)
Back Pain, Adult °Low back pain is very common. About 1 in 5 people have back pain. The cause of low back pain is rarely dangerous. The pain often gets better over time. About half of people with a sudden onset of back pain feel better in just 2 weeks. About 8 in 10 people feel better by 6 weeks.  °CAUSES °Some common causes of back pain include: °· Strain of the muscles or ligaments supporting the spine. °· Wear and tear (degeneration) of the spinal discs. °· Arthritis. °· Direct injury to the back. °DIAGNOSIS °Most of the time, the direct cause of low back pain is not known. However, back pain can be treated effectively even when the exact cause of the pain is unknown. Answering your caregiver's questions about your overall health and symptoms is one of the most accurate ways to make sure the cause of your pain is not dangerous. If your caregiver needs more information, he or she may order lab work or imaging tests (X-rays or MRIs). However, even if imaging tests show changes in your back, this usually does not require surgery. °HOME CARE INSTRUCTIONS °For many people, back pain returns. Since low back pain is rarely dangerous, it is often a condition that people can learn to manage on their own.  °· Remain active. It is stressful on the back to sit or stand in one place. Do not sit, drive, or stand in one place for more than 30 minutes at a time. Take short walks on level surfaces as soon as pain allows. Try to increase the length of time you walk each day. °· Do not stay in bed. Resting more than 1 or 2 days can delay your recovery. °· Do not avoid exercise or work. Your body is made to move. It is not dangerous to be active, even though your back may hurt. Your back will likely heal faster if you return to being active before your pain is gone. °· Pay attention to your body when you  bend and lift. Many people have less discomfort when lifting if they bend their knees, keep the load close to their bodies, and  avoid twisting. Often, the most comfortable positions are those that put less stress on your recovering back. °· Find a comfortable position to sleep. Use a firm mattress and lie on your side with your knees slightly bent. If you lie on your back, put a pillow under your knees. °· Only take over-the-counter or prescription medicines as directed by your caregiver. Over-the-counter medicines to reduce pain and inflammation are often the most helpful. Your caregiver may prescribe muscle relaxant drugs. These medicines help dull your pain so you can more quickly return to your normal activities and healthy exercise. °· Put ice on the injured area. °¨ Put ice in a plastic bag. °¨ Place a towel between your skin and the bag. °¨ Leave the ice on for 15-20 minutes, 03-04 times a day for the first 2 to 3 days. After that, ice and heat may be alternated to reduce pain and spasms. °· Ask your caregiver about trying back exercises and gentle massage. This may be of some benefit. °· Avoid feeling anxious or stressed. Stress increases muscle tension and can worsen back pain. It is important to recognize when you are anxious or stressed and learn ways to manage it. Exercise is a great option. °SEEK MEDICAL CARE IF: °· You have pain that is not relieved with rest or medicine. °· You have pain that does not improve in 1 week. °· You have new symptoms. °· You are generally not feeling well. °SEEK   IMMEDIATE MEDICAL CARE IF:  °· You have pain that radiates from your back into your legs. °· You develop new bowel or bladder control problems. °· You have unusual weakness or numbness in your arms or legs. °· You develop nausea or vomiting. °· You develop abdominal pain. °· You feel faint. °Document Released: 06/06/2005 Document Revised: 12/06/2011 Document Reviewed: 10/08/2013 °Exit[employer]® Patient Information ©2015 [employer]. This information is not intended to replace advice given to you by your health care provider. Make sure you  discuss any questions you have with your health care provider. °Chest Pain (Nonspecific) °It is often hard to give a specific diagnosis for the cause of chest pain. There is always a chance that your pain could be related to something serious, such as a heart attack or a blood clot in the lungs. You need to follow up with your health care provider for further evaluation. °CAUSES  °· Heartburn. °· Pneumonia or bronchitis. °· Anxiety or stress. °· Inflammation around your heart (pericarditis) or lung (pleuritis or pleurisy). °· A blood clot in the lung. °· A collapsed lung (pneumothorax). It can develop suddenly on its own (spontaneous pneumothorax) or from trauma to the chest. °· Shingles infection (herpes zoster virus). °The chest wall is composed of bones, muscles, and cartilage. Any of these can be the source of the pain. °· The bones can be bruised by injury. °· The muscles or cartilage can be strained by coughing or overwork. °· The cartilage can be affected by inflammation and become sore (costochondritis). °DIAGNOSIS  °Lab tests or other studies may be needed to find the cause of your pain. Your health care provider may have you take a test called an ambulatory electrocardiogram (ECG). An ECG records your heartbeat patterns over a 24-hour period. You may also have other tests, such as: °· Transthoracic echocardiogram (TTE). During echocardiography, sound waves are used to evaluate how blood flows through your heart. °· Transesophageal echocardiogram (TEE). °· Cardiac monitoring. This allows your health care provider to monitor your heart rate and rhythm in real time. °· Holter monitor. This is a portable device that records your heartbeat and can help diagnose heart arrhythmias. It allows your health care provider to track your heart activity for several days, if needed. °· Stress tests by exercise or by giving medicine that makes the heart beat faster. °TREATMENT  °· Treatment depends on what may be causing  your chest pain. Treatment may include: °· Acid blockers for heartburn. °· Anti-inflammatory medicine. °· Pain medicine for inflammatory conditions. °· Antibiotics if an infection is present. °· You may be advised to change lifestyle habits. This includes stopping smoking and avoiding alcohol, caffeine, and chocolate. °· You may be advised to keep your head raised (elevated) when sleeping. This reduces the chance of acid going backward from your stomach into your esophagus. °Most of the time, nonspecific chest pain will improve within 2-3 days with rest and mild pain medicine.  °HOME CARE INSTRUCTIONS  °· If antibiotics were prescribed, take them as directed. Finish them even if you start to feel better. °· For the next few days, avoid physical activities that bring on chest pain. Continue physical activities as directed. °· Do not use any tobacco products, including cigarettes, chewing tobacco, or electronic cigarettes. °· Avoid drinking alcohol. °· Only take medicine as directed by your health care provider. °· Follow your health care provider's suggestions for further testing if your chest pain does not go away. °· Keep any follow-up appointments you   made. If you do not go to an appointment, you could develop lasting (chronic) problems with pain. If there is any problem keeping an appointment, call to reschedule. °SEEK MEDICAL CARE IF:  °· Your chest pain does not go away, even after treatment. °· You have a rash with blisters on your chest. °· You have a fever. °SEEK IMMEDIATE MEDICAL CARE IF:  °· You have increased chest pain or pain that spreads to your arm, neck, jaw, back, or abdomen. °· You have shortness of breath. °· You have an increasing cough, or you cough up blood. °· You have severe back or abdominal pain. °· You feel nauseous or vomit. °· You have severe weakness. °· You faint. °· You have chills. °This is an emergency. Do not wait to see if the pain will go away. Get medical help at once. Call your  local emergency services (911 in [place] Do not drive yourself to the hospital. °MAKE SURE YOU:  °· Understand these instructions. °· Will watch your condition. °· Will get help right away if you are not doing well or get worse. °Document Released: 03/16/2005 Document Revised: 06/11/2013 Document Reviewed: 01/10/2008 °Exit[employer]® Patient Information ©2015 [employer]. This information is not intended to replace advice given to you by your health care provider. Make sure you discuss any questions you have with your health care provider. ° °

## 2014-11-06 MED ORDER — VENLAFAXINE HCL ER 37.5 MG PO CP24: 37.5000 mg | ORAL_CAPSULE | Freq: Every day | ORAL | Status: DC

## 2014-11-06 NOTE — Telephone Encounter (Signed)
We can start with Effexor XR 37.5 mg 2 weeks. If does well then can increase to 75 mg at that time.

## 2014-11-06 NOTE — Telephone Encounter (Signed)
Pt informed states symptoms have stopped, what other antidepressant would be an option? Pt said she tried Zoloft in past and it made her feel funny. Please advise

## 2014-11-06 NOTE — Telephone Encounter (Signed)
Pt informed, Rx sent. 

## 2015-05-28 ENCOUNTER — Telehealth: Payer: Self-pay | Admitting: *Deleted

## 2015-05-28 ENCOUNTER — Ambulatory Visit (INDEPENDENT_AMBULATORY_CARE_PROVIDER_SITE_OTHER): Payer: BLUE CROSS/BLUE SHIELD | Admitting: Gynecology

## 2015-05-28 ENCOUNTER — Encounter: Payer: Self-pay | Admitting: Gynecology

## 2015-05-28 VITALS — BP 130/84 | Ht 68.5 in | Wt 326.0 lb

## 2015-05-28 DIAGNOSIS — N393 Stress incontinence (female) (male): Secondary | ICD-10-CM

## 2015-05-28 DIAGNOSIS — Z01419 Encounter for gynecological examination (general) (routine) without abnormal findings: Secondary | ICD-10-CM | POA: Diagnosis not present

## 2015-05-28 DIAGNOSIS — Z1329 Encounter for screening for other suspected endocrine disorder: Secondary | ICD-10-CM

## 2015-05-28 DIAGNOSIS — Z13220 Encounter for screening for lipoid disorders: Secondary | ICD-10-CM

## 2015-05-28 LAB — COMPREHENSIVE METABOLIC PANEL
ALT: 26 U/L (ref 6–29)
AST: 25 U/L (ref 10–30)
Albumin: 4 g/dL (ref 3.6–5.1)
Alkaline Phosphatase: 82 U/L (ref 33–115)
BUN: 15 mg/dL (ref 7–25)
CO2: 21 mmol/L (ref 20–31)
Calcium: 9.6 mg/dL (ref 8.6–10.2)
Chloride: 102 mmol/L (ref 98–110)
Creat: 0.72 mg/dL (ref 0.50–1.10)
Glucose, Bld: 91 mg/dL (ref 65–99)
Potassium: 4 mmol/L (ref 3.5–5.3)
Sodium: 135 mmol/L (ref 135–146)
Total Bilirubin: 0.6 mg/dL (ref 0.2–1.2)
Total Protein: 7.3 g/dL (ref 6.1–8.1)

## 2015-05-28 LAB — LIPID PANEL
Cholesterol: 225 mg/dL — ABNORMAL HIGH (ref 125–200)
HDL: 59 mg/dL (ref >=46)
LDL Cholesterol: 144 mg/dL — ABNORMAL HIGH (ref <130)
Total CHOL/HDL Ratio: 3.8 Ratio (ref <=5.0)
Triglycerides: 112 mg/dL (ref <150)
VLDL: 22 mg/dL (ref <30)

## 2015-05-28 LAB — CBC WITH DIFFERENTIAL/PLATELET
Basophils Absolute: 0 10*3/uL (ref 0.0–0.1)
Basophils Relative: 0 % (ref 0–1)
Eosinophils Absolute: 0.3 10*3/uL (ref 0.0–0.7)
Eosinophils Relative: 3 % (ref 0–5)
HCT: 41.5 % (ref 36.0–46.0)
Hemoglobin: 13.7 g/dL (ref 12.0–15.0)
Lymphocytes Relative: 19 % (ref 12–46)
Lymphs Abs: 1.7 10*3/uL (ref 0.7–4.0)
MCH: 29 pg (ref 26.0–34.0)
MCHC: 33 g/dL (ref 30.0–36.0)
MCV: 87.9 fL (ref 78.0–100.0)
MPV: 11.1 fL (ref 8.6–12.4)
Monocytes Absolute: 0.8 10*3/uL (ref 0.1–1.0)
Monocytes Relative: 9 % (ref 3–12)
Neutro Abs: 6.2 10*3/uL (ref 1.7–7.7)
Neutrophils Relative %: 69 % (ref 43–77)
Platelets: 367 10*3/uL (ref 150–400)
RBC: 4.72 MIL/uL (ref 3.87–5.11)
RDW: 14.1 % (ref 11.5–15.5)
WBC: 9 10*3/uL (ref 4.0–10.5)

## 2015-05-28 LAB — HEMOGLOBIN A1C
Hgb A1c MFr Bld: 5.5 % (ref <5.7)
Mean Plasma Glucose: 111 mg/dL (ref <117)

## 2015-05-28 LAB — TSH: TSH: 4.498 u[IU]/mL (ref 0.350–4.500)

## 2015-05-28 NOTE — Patient Instructions (Signed)
Office will call you to arrange the urology appointment  Schedule your mammogram  You may obtain a copy of any labs that were done today by logging onto MyChart as outlined in the instructions provided with your AVS (after visit summary). The office will not call with normal lab results but certainly if there are any significant abnormalities then we will contact you.   Health Maintenance Adopting a healthy lifestyle and getting preventive care can go a long way to promote health and wellness. Talk with your health care provider about what schedule of regular examinations is right for you. This is a good chance for you to check in with your provider about disease prevention and staying healthy. In between checkups, there are plenty of things you can do on your own. Experts have done a lot of research about which lifestyle changes and preventive measures are most likely to keep you healthy. Ask your health care provider for more information. WEIGHT AND DIET  Eat a healthy diet  Be sure to include plenty of vegetables, fruits, low-fat dairy products, and lean protein.  Do not eat a lot of foods high in solid fats, added sugars, or salt.  Get regular exercise. This is one of the most important things you can do for your health.  Most adults should exercise for at least 150 minutes each week. The exercise should increase your heart rate and make you sweat (moderate-intensity exercise).  Most adults should also do strengthening exercises at least twice a week. This is in addition to the moderate-intensity exercise.  Maintain a healthy weight  Body mass index (BMI) is a measurement that can be used to identify possible weight problems. It estimates body fat based on height and weight. Your health care provider can help determine your BMI and help you achieve or maintain a healthy weight.  For females 55 years of age and older:   A BMI below 18.5 is considered underweight.  A BMI of 18.5 to  24.9 is normal.  A BMI of 25 to 29.9 is considered overweight.  A BMI of 30 and above is considered obese.  Watch levels of cholesterol and blood lipids  You should start having your blood tested for lipids and cholesterol at 44 years of age, then have this test every 5 years.  You may need to have your cholesterol levels checked more often if:  Your lipid or cholesterol levels are high.  You are older than 44 years of age.  You are at high risk for heart disease.  CANCER SCREENING   Lung Cancer  Lung cancer screening is recommended for adults 99-24 years old who are at high risk for lung cancer because of a history of smoking.  A yearly low-dose CT scan of the lungs is recommended for people who:  Currently smoke.  Have quit within the past 15 years.  Have at least a 30-pack-year history of smoking. A pack year is smoking an average of one pack of cigarettes a day for 1 year.  Yearly screening should continue until it has been 15 years since you quit.  Yearly screening should stop if you develop a health problem that would prevent you from having lung cancer treatment.  Breast Cancer  Practice breast self-awareness. This means understanding how your breasts normally appear and feel.  It also means doing regular breast self-exams. Let your health care provider know about any changes, no matter how small.  If you are in your 20s or 30s, you  should have a clinical breast exam (CBE) by a health care provider every 1-3 years as part of a regular health exam.  If you are 84 or older, have a CBE every year. Also consider having a breast X-ray (mammogram) every year.  If you have a family history of breast cancer, talk to your health care provider about genetic screening.  If you are at high risk for breast cancer, talk to your health care provider about having an MRI and a mammogram every year.  Breast cancer gene (BRCA) assessment is recommended for women who have family  members with BRCA-related cancers. BRCA-related cancers include:  Breast.  Ovarian.  Tubal.  Peritoneal cancers.  Results of the assessment will determine the need for genetic counseling and BRCA1 and BRCA2 testing. Cervical Cancer Routine pelvic examinations to screen for cervical cancer are no longer recommended for nonpregnant women who are considered low risk for cancer of the pelvic organs (ovaries, uterus, and vagina) and who do not have symptoms. A pelvic examination may be necessary if you have symptoms including those associated with pelvic infections. Ask your health care provider if a screening pelvic exam is right for you.   The Pap test is the screening test for cervical cancer for women who are considered at risk.  If you had a hysterectomy for a problem that was not cancer or a condition that could lead to cancer, then you no longer need Pap tests.  If you are older than 65 years, and you have had normal Pap tests for the past 10 years, you no longer need to have Pap tests.  If you have had past treatment for cervical cancer or a condition that could lead to cancer, you need Pap tests and screening for cancer for at least 20 years after your treatment.  If you no longer get a Pap test, assess your risk factors if they change (such as having a new sexual partner). This can affect whether you should start being screened again.  Some women have medical problems that increase their chance of getting cervical cancer. If this is the case for you, your health care provider may recommend more frequent screening and Pap tests.  The human papillomavirus (HPV) test is another test that may be used for cervical cancer screening. The HPV test looks for the virus that can cause cell changes in the cervix. The cells collected during the Pap test can be tested for HPV.  The HPV test can be used to screen women 59 years of age and older. Getting tested for HPV can extend the interval  between normal Pap tests from three to five years.  An HPV test also should be used to screen women of any age who have unclear Pap test results.  After 44 years of age, women should have HPV testing as often as Pap tests.  Colorectal Cancer  This type of cancer can be detected and often prevented.  Routine colorectal cancer screening usually begins at 44 years of age and continues through 44 years of age.  Your health care provider may recommend screening at an earlier age if you have risk factors for colon cancer.  Your health care provider may also recommend using home test kits to check for hidden blood in the stool.  A small camera at the end of a tube can be used to examine your colon directly (sigmoidoscopy or colonoscopy). This is done to check for the earliest forms of colorectal cancer.  Routine screening  usually begins at age 35.  Direct examination of the colon should be repeated every 5-10 years through 43 years of age. However, you may need to be screened more often if early forms of precancerous polyps or small growths are found. Skin Cancer  Check your skin from head to toe regularly.  Tell your health care provider about any new moles or changes in moles, especially if there is a change in a mole's shape or color.  Also tell your health care provider if you have a mole that is larger than the size of a pencil eraser.  Always use sunscreen. Apply sunscreen liberally and repeatedly throughout the day.  Protect yourself by wearing long sleeves, pants, a wide-brimmed hat, and sunglasses whenever you are outside. HEART DISEASE, DIABETES, AND HIGH BLOOD PRESSURE   Have your blood pressure checked at least every 1-2 years. High blood pressure causes heart disease and increases the risk of stroke.  If you are between 54 years and 23 years old, ask your health care provider if you should take aspirin to prevent strokes.  Have regular diabetes screenings. This involves  taking a blood sample to check your fasting blood sugar level.  If you are at a normal weight and have a low risk for diabetes, have this test once every three years after 44 years of age.  If you are overweight and have a high risk for diabetes, consider being tested at a younger age or more often. PREVENTING INFECTION  Hepatitis B  If you have a higher risk for hepatitis B, you should be screened for this virus. You are considered at high risk for hepatitis B if:  You were born in a country where hepatitis B is common. Ask your health care provider which countries are considered high risk.  Your parents were born in a high-risk country, and you have not been immunized against hepatitis B (hepatitis B vaccine).  You have HIV or AIDS.  You use needles to inject street drugs.  You live with someone who has hepatitis B.  You have had sex with someone who has hepatitis B.  You get hemodialysis treatment.  You take certain medicines for conditions, including cancer, organ transplantation, and autoimmune conditions. Hepatitis C  Blood testing is recommended for:  Everyone born from [birth year] through 1965.  Anyone with known risk factors for hepatitis C. Sexually transmitted infections (STIs)  You should be screened for sexually transmitted infections (STIs) including gonorrhea and chlamydia if:  You are sexually active and are younger than 44 years of age.  You are older than 44 years of age and your health care provider tells you that you are at risk for this type of infection.  Your sexual activity has changed since you were last screened and you are at an increased risk for chlamydia or gonorrhea. Ask your health care provider if you are at risk.  If you do not have HIV, but are at risk, it may be recommended that you take a prescription medicine daily to prevent HIV infection. This is called pre-exposure prophylaxis (PrEP). You are considered at risk if:  You are sexually active  and do not regularly use condoms or know the HIV status of your partner(s).  You take drugs by injection.  You are sexually active with a partner who has HIV. Talk with your health care provider about whether you are at high risk of being infected with HIV. If you choose to begin PrEP, you should first be tested  for HIV. You should then be tested every 3 months for as long as you are taking PrEP.  PREGNANCY   If you are premenopausal and you may become pregnant, ask your health care provider about preconception counseling.  If you may become pregnant, take 400 to 800 micrograms (mcg) of folic acid every day.  If you want to prevent pregnancy, talk to your health care provider about birth control (contraception). OSTEOPOROSIS AND MENOPAUSE   Osteoporosis is a disease in which the bones lose minerals and strength with aging. This can result in serious bone fractures. Your risk for osteoporosis can be identified using a bone density scan.  If you are 23 years of age or older, or if you are at risk for osteoporosis and fractures, ask your health care provider if you should be screened.  Ask your health care provider whether you should take a calcium or vitamin D supplement to lower your risk for osteoporosis.  Menopause may have certain physical symptoms and risks.  Hormone replacement therapy may reduce some of these symptoms and risks. Talk to your health care provider about whether hormone replacement therapy is right for you.  HOME CARE INSTRUCTIONS   Schedule regular health, dental, and eye exams.  Stay current with your immunizations.   Do not use any tobacco products including cigarettes, chewing tobacco, or electronic cigarettes.  If you are pregnant, do not drink alcohol.  If you are breastfeeding, limit how much and how often you drink alcohol.  Limit alcohol intake to no more than 1 drink per day for nonpregnant women. One drink equals 12 ounces of beer, 5 ounces of wine,  or 1 ounces of hard liquor.  Do not use street drugs.  Do not share needles.  Ask your health care provider for help if you need support or information about quitting drugs.  Tell your health care provider if you often feel depressed.  Tell your health care provider if you have ever been abused or do not feel safe at home. Document Released: 12/20/2010 Document Revised: 10/21/2013 Document Reviewed: 05/08/2013 [facility] Patient Information 2015 [place], [place]. This information is not intended to replace advice given to you by your health care provider. Make sure you discuss any questions you have with your health care provider.

## 2015-05-28 NOTE — Progress Notes (Signed)
[name] Aug 19, 1970 [id number]        44 y.o.  G2P1011  No LMP recorded. Patient has had a hysterectomy. for annual exam.  Several issues noted below.  Past medical history,surgical history, problem list, medications, allergies, family history and social history were all reviewed and documented as reviewed in the EPIC chart.  ROS:  Performed with pertinent positives and negatives included in the history, assessment and plan.   Additional significant findings :  none   Exam: Kim Counsellor Vitals:   05/28/15 0853  BP: 130/84  Height: 5' 8.5" (1.74 m)  Weight: 326 lb (147.873 kg)   General appearance:  Normal affect, orientation and appearance. Skin: Grossly normal HEENT: Without gross lesions.  No cervical or supraclavicular adenopathy. Thyroid normal.  Lungs:  Clear without wheezing, rales or rhonchi Cardiac: RR, without RMG Abdominal:  Soft, nontender, without masses, guarding, rebound, organomegaly or hernia Breasts:  Examined lying and sitting without masses, retractions, discharge or axillary adenopathy. Pelvic:  Ext/BUS/vagina normal  Adnexa  Without masses or tenderness    Anus and perineum  Normal   Rectovaginal  Normal sphincter tone without palpated masses or tenderness.    Assessment/Plan:  44 y.o. G62P1011 female for annual exam, history of LAVH.   1. Stress urinary incontinence. Patient notes that her symptoms have worsened which is leaking almost all the time. Exam shows no significant cystocele or prolapse. 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  Check urinalysis today. Recommend urology evaluation will go ahead and help arrange this. 2. Mammography 10/2013. Patient reminded she is overdue and she agrees to schedule. SBE monthly reviewed. 3. Pap smear 2015 normal. No Pap smear done today. No history of significant abnormal Pap smears. Options to stop screening per current screening guidelines and she is status post hysterectomy for benign indications reviewed. Will  readdress on an annual basis. 4. History of depression.  I started her on fluoxetine but she subsequently saw a psychiatrist and is actively being managed by them and currently is on Celexa doing much better. She will continue to follow up with them. 5. Health maintenance. Patient requests routine labs. CBC, comprehensive metabolic panel, lipid profile, urinalysis, TSH and hemoglobin A1c ordered. Follow up in one year, sooner as needed.   Anastasio Auerbach MD, 9:20 AM 05/28/2015

## 2015-05-28 NOTE — Telephone Encounter (Signed)
Appointment on 07/12/14 @ 2:45pm with Dr.MacDiarmid left message for pt to call.

## 2015-05-28 NOTE — Telephone Encounter (Signed)
Notes faxed to [facility] they will contact with time and date.

## 2015-05-28 NOTE — Telephone Encounter (Signed)
-----   Message from Anastasio Auerbach, MD sent at 05/28/2015  9:25 AM EST ----- Schedule urology appointment reference worsening urinary incontinence

## 2015-05-29 ENCOUNTER — Other Ambulatory Visit: Payer: Self-pay | Admitting: Gynecology

## 2015-05-29 DIAGNOSIS — R7989 Other specified abnormal findings of blood chemistry: Secondary | ICD-10-CM

## 2015-05-29 LAB — URINALYSIS W MICROSCOPIC + REFLEX CULTURE
Bilirubin Urine: NEGATIVE
Casts: NONE SEEN [LPF]
Glucose, UA: NEGATIVE
Hgb urine dipstick: NEGATIVE
Ketones, ur: NEGATIVE
Leukocytes, UA: NEGATIVE
Nitrite: POSITIVE — AB
Protein, ur: NEGATIVE
Specific Gravity, Urine: 1.019 (ref 1.001–1.035)
Yeast: NONE SEEN [HPF]
pH: 6 (ref 5.0–8.0)

## 2015-05-29 NOTE — Telephone Encounter (Signed)
Pt informed with the below note. 

## 2015-05-31 LAB — URINE CULTURE: Colony Count: >=100000

## 2015-06-01 ENCOUNTER — Other Ambulatory Visit: Payer: Self-pay | Admitting: Gynecology

## 2015-06-01 MED ORDER — FLUCONAZOLE 150 MG PO TABS: 150.0000 mg | ORAL_TABLET | Freq: Once | ORAL | Status: DC

## 2015-06-01 MED ORDER — SULFAMETHOXAZOLE-TRIMETHOPRIM 800-160 MG PO TABS: 1.0000 | ORAL_TABLET | Freq: Two times a day (BID) | ORAL | Status: DC

## 2015-06-02 ENCOUNTER — Other Ambulatory Visit: Payer: BLUE CROSS/BLUE SHIELD

## 2015-06-02 DIAGNOSIS — R7989 Other specified abnormal findings of blood chemistry: Secondary | ICD-10-CM

## 2015-06-02 LAB — THYROID PANEL WITH TSH
Free Thyroxine Index: 1.5 (ref 1.4–3.8)
T3 Uptake: 29 % (ref 22–35)
T4, Total: 5.3 ug/dL (ref 4.5–12.0)
TSH: 3.854 u[IU]/mL (ref 0.350–4.500)

## 2015-08-21 ENCOUNTER — Other Ambulatory Visit: Payer: Self-pay | Admitting: Urology

## 2015-09-03 NOTE — Patient Instructions (Addendum)
[name]  09/03/2015   Your procedure is scheduled on: Thursday 09/10/2015    Report to [facility] Main  Entrance take [facility]  elevators to 3rd floor to  [name] at    0900 AM.  Call this number if you have problems the morning of surgery [phone]   Remember: ONLY 1 PERSON MAY GO WITH YOU TO SHORT STAY TO GET  READY MORNING OF [name].  Do not eat food or drink liquids :After Midnight.     Take these medicines the morning of surgery with A SIP OF WATER: Citalopram ( Celexa)                                You may not have any metal on your body including hair pins and              piercings  Do not wear jewelry, make-up, lotions, powders or perfumes, deodorant             Do not wear nail polish.  Do not shave  48 hours prior to surgery.                 Do not bring valuables to the hospital. [name].  Contacts, dentures or bridgework may not be worn into surgery.  .     Patients discharged the day of surgery will not be allowed to drive home.  Name and phone number of your driver: [name] (husband) [phone]                Please read over the following fact sheets you were given: _____________________________________________________________________             [facility] - Preparing for Surgery Before surgery, you can play an important role.  Because skin is not sterile, your skin needs to be as free of germs as possible.  You can reduce the number of germs on your skin by washing with CHG (chlorahexidine gluconate) soap before surgery.  CHG is an antiseptic cleaner which kills germs and bonds with the skin to continue killing germs even after washing. Please DO NOT use if you have an allergy to CHG or antibacterial soaps.  If your skin becomes reddened/irritated stop using the CHG and inform your nurse when you arrive at Short Stay. Do not shave (including legs and underarms)  for at least 48 hours prior to the first CHG shower.  You may shave your face/neck. Please follow these instructions carefully:  1.  Shower with CHG Soap the night before surgery and the  morning of Surgery.  2.  If you choose to wash your hair, wash your hair first as usual with your  normal  shampoo.  3.  After you shampoo, rinse your hair and body thoroughly to remove the  shampoo.                           4.  Use CHG as you would any other liquid soap.  You can apply chg directly  to the skin and wash  Gently with a scrungie or clean washcloth.  5.  Apply the CHG Soap to your body ONLY FROM THE NECK DOWN.   Do not use on face/ open                           Wound or open sores. Avoid contact with eyes, ears mouth and genitals (private parts).                       Wash face,  Genitals (private parts) with your normal soap.             6.  Wash thoroughly, paying special attention to the area where your surgery  will be performed.  7.  Thoroughly rinse your body with warm water from the neck down.  8.  DO NOT shower/wash with your normal soap after using and rinsing off  the CHG Soap.                9.  Pat yourself dry with a clean towel.            10.  Wear clean pajamas.            11.  Place clean sheets on your bed the night of your first shower and do not  sleep with pets. Day of Surgery : Do not apply any lotions/deodorants the morning of surgery.  Please wear clean clothes to the hospital/surgery center.  FAILURE TO FOLLOW THESE INSTRUCTIONS MAY RESULT IN THE CANCELLATION OF YOUR SURGERY PATIENT SIGNATURE_________________________________  NURSE SIGNATURE__________________________________  ________________________________________________________________________

## 2015-09-04 ENCOUNTER — Encounter (HOSPITAL_COMMUNITY): Payer: Self-pay

## 2015-09-04 ENCOUNTER — Encounter (HOSPITAL_COMMUNITY)
Admission: RE | Admit: 2015-09-04 | Discharge: 2015-09-04 | Disposition: A | Discharge status: Home / Self Care | Payer: BLUE CROSS/BLUE SHIELD | Source: Ambulatory Visit | Attending: Urology | Admitting: Urology

## 2015-09-04 DIAGNOSIS — N393 Stress incontinence (female) (male): Secondary | ICD-10-CM | POA: Diagnosis not present

## 2015-09-04 DIAGNOSIS — Z01812 Encounter for preprocedural laboratory examination: Secondary | ICD-10-CM | POA: Insufficient documentation

## 2015-09-04 HISTORY — DX: Cardiac arrhythmia, unspecified: I49.9

## 2015-09-04 LAB — CBC
HCT: 42.6 % (ref 36.0–46.0)
Hemoglobin: 13.5 g/dL (ref 12.0–15.0)
MCH: 29.2 pg (ref 26.0–34.0)
MCHC: 31.7 g/dL (ref 30.0–36.0)
MCV: 92.2 fL (ref 78.0–100.0)
Platelets: 323 10*3/uL (ref 150–400)
RBC: 4.62 MIL/uL (ref 3.87–5.11)
RDW: 14 % (ref 11.5–15.5)
WBC: 7.2 10*3/uL (ref 4.0–10.5)

## 2015-09-04 NOTE — Pre-Procedure Instructions (Signed)
EKG 11-06-14 epic CXR 11-05-14 epic

## 2015-09-09 MED ORDER — DEXTROSE 5 % IV SOLN
3.0000 g | INTRAVENOUS | Status: AC
  Administered 2015-09-10: 3 g via INTRAVENOUS
  Filled 2015-09-09: qty 3000

## 2015-09-10 ENCOUNTER — Encounter (HOSPITAL_COMMUNITY)
Admission: RE | Disposition: A | Discharge status: Home / Self Care | Payer: Self-pay | Source: Ambulatory Visit | Attending: Urology

## 2015-09-10 ENCOUNTER — Encounter (HOSPITAL_COMMUNITY): Payer: Self-pay | Admitting: *Deleted

## 2015-09-10 ENCOUNTER — Ambulatory Visit (HOSPITAL_COMMUNITY): Payer: BLUE CROSS/BLUE SHIELD | Admitting: Anesthesiology

## 2015-09-10 ENCOUNTER — Ambulatory Visit (HOSPITAL_COMMUNITY)
Admission: RE | Admit: 2015-09-10 | Discharge: 2015-09-10 | Disposition: A | Discharge status: Home / Self Care | Payer: BLUE CROSS/BLUE SHIELD | Source: Ambulatory Visit | Attending: Urology | Admitting: Urology

## 2015-09-10 DIAGNOSIS — N393 Stress incontinence (female) (male): Secondary | ICD-10-CM | POA: Insufficient documentation

## 2015-09-10 DIAGNOSIS — F329 Major depressive disorder, single episode, unspecified: Secondary | ICD-10-CM | POA: Insufficient documentation

## 2015-09-10 DIAGNOSIS — Z9884 Bariatric surgery status: Secondary | ICD-10-CM | POA: Diagnosis not present

## 2015-09-10 DIAGNOSIS — Z6841 Body Mass Index (BMI) 40.0 and over, adult: Secondary | ICD-10-CM | POA: Insufficient documentation

## 2015-09-10 HISTORY — PX: PUBOVAGINAL SLING: SHX1035

## 2015-09-10 SURGERY — CREATION, PUBOVAGINAL SLING
Anesthesia: General

## 2015-09-10 MED ORDER — FENTANYL CITRATE (PF) 100 MCG/2ML IJ SOLN
INTRAMUSCULAR | Status: DC | PRN
  Administered 2015-09-10 (×3): 50 ug via INTRAVENOUS
  Administered 2015-09-10: 100 ug via INTRAVENOUS

## 2015-09-10 MED ORDER — SUGAMMADEX SODIUM 200 MG/2ML IV SOLN
INTRAVENOUS | Status: AC
  Filled 2015-09-10: qty 2

## 2015-09-10 MED ORDER — MIDAZOLAM HCL 2 MG/2ML IJ SOLN
INTRAMUSCULAR | Status: AC
  Filled 2015-09-10: qty 2

## 2015-09-10 MED ORDER — KETOROLAC TROMETHAMINE 30 MG/ML IJ SOLN
INTRAMUSCULAR | Status: DC | PRN
  Administered 2015-09-10: 30 mg via INTRAVENOUS

## 2015-09-10 MED ORDER — KETOROLAC TROMETHAMINE 30 MG/ML IJ SOLN
INTRAMUSCULAR | Status: AC
  Filled 2015-09-10: qty 1

## 2015-09-10 MED ORDER — PROPOFOL 10 MG/ML IV BOLUS
INTRAVENOUS | Status: AC
  Filled 2015-09-10: qty 20

## 2015-09-10 MED ORDER — GLYCOPYRROLATE 0.2 MG/ML IJ SOLN
INTRAMUSCULAR | Status: AC
  Filled 2015-09-10: qty 2

## 2015-09-10 MED ORDER — BUPIVACAINE-EPINEPHRINE (PF) 0.5% -1:200000 IJ SOLN
INTRAMUSCULAR | Status: DC | PRN
  Administered 2015-09-10: 15 mL

## 2015-09-10 MED ORDER — METOCLOPRAMIDE HCL 5 MG/ML IJ SOLN
INTRAMUSCULAR | Status: AC
  Filled 2015-09-10: qty 2

## 2015-09-10 MED ORDER — DEXAMETHASONE SODIUM PHOSPHATE 10 MG/ML IJ SOLN
INTRAMUSCULAR | Status: AC
  Filled 2015-09-10: qty 1

## 2015-09-10 MED ORDER — LIDOCAINE HCL (CARDIAC) 20 MG/ML IV SOLN
INTRAVENOUS | Status: DC | PRN
  Administered 2015-09-10: 50 mg via INTRAVENOUS

## 2015-09-10 MED ORDER — ESTRADIOL 0.1 MG/GM VA CREA
TOPICAL_CREAM | VAGINAL | Status: AC
  Filled 2015-09-10: qty 42.5

## 2015-09-10 MED ORDER — BUPIVACAINE-EPINEPHRINE (PF) 0.5% -1:200000 IJ SOLN
INTRAMUSCULAR | Status: AC
  Filled 2015-09-10: qty 30

## 2015-09-10 MED ORDER — OXYCODONE-ACETAMINOPHEN 5-325 MG PO TABS: 1.0000 | ORAL_TABLET | ORAL | Status: DC | PRN

## 2015-09-10 MED ORDER — MIDAZOLAM HCL 5 MG/5ML IJ SOLN
INTRAMUSCULAR | Status: DC | PRN
  Administered 2015-09-10: 2 mg via INTRAVENOUS

## 2015-09-10 MED ORDER — PROPOFOL 10 MG/ML IV BOLUS
INTRAVENOUS | Status: DC | PRN
  Administered 2015-09-10: 180 mg via INTRAVENOUS

## 2015-09-10 MED ORDER — POLYMYXIN B SULFATE 500000 UNITS IJ SOLR
INTRAMUSCULAR | Status: DC | PRN
  Administered 2015-09-10: 500 mL

## 2015-09-10 MED ORDER — SODIUM CHLORIDE 0.9 % IR SOLN
Status: AC
  Filled 2015-09-10: qty 1

## 2015-09-10 MED ORDER — FLUORESCEIN SODIUM 10 % IJ SOLN
500.0000 mg | Freq: Once | INTRAMUSCULAR | Status: DC
  Filled 2015-09-10: qty 5

## 2015-09-10 MED ORDER — BELLADONNA ALKALOIDS-OPIUM 16.2-60 MG RE SUPP
RECTAL | Status: AC
  Filled 2015-09-10: qty 1

## 2015-09-10 MED ORDER — METOCLOPRAMIDE HCL 5 MG/ML IJ SOLN
INTRAMUSCULAR | Status: DC | PRN
  Administered 2015-09-10: 10 mg via INTRAVENOUS

## 2015-09-10 MED ORDER — LACTATED RINGERS IV SOLN: INTRAVENOUS | Status: DC

## 2015-09-10 MED ORDER — ONDANSETRON HCL 4 MG/2ML IJ SOLN
INTRAMUSCULAR | Status: AC
  Filled 2015-09-10: qty 2

## 2015-09-10 MED ORDER — ROCURONIUM BROMIDE 100 MG/10ML IV SOLN
INTRAVENOUS | Status: DC | PRN
  Administered 2015-09-10: 10 mg via INTRAVENOUS
  Administered 2015-09-10: 40 mg via INTRAVENOUS

## 2015-09-10 MED ORDER — DEXAMETHASONE SODIUM PHOSPHATE 10 MG/ML IJ SOLN
INTRAMUSCULAR | Status: DC | PRN
  Administered 2015-09-10: 10 mg via INTRAVENOUS

## 2015-09-10 MED ORDER — SUGAMMADEX SODIUM 200 MG/2ML IV SOLN
INTRAVENOUS | Status: DC | PRN
  Administered 2015-09-10: 200 mg via INTRAVENOUS

## 2015-09-10 MED ORDER — LIDOCAINE HCL (CARDIAC) 20 MG/ML IV SOLN
INTRAVENOUS | Status: AC
  Filled 2015-09-10: qty 5

## 2015-09-10 MED ORDER — FENTANYL CITRATE (PF) 100 MCG/2ML IJ SOLN: 25.0000 ug | INTRAMUSCULAR | Status: DC | PRN

## 2015-09-10 MED ORDER — GLYCOPYRROLATE 0.2 MG/ML IJ SOLN
INTRAMUSCULAR | Status: DC | PRN
  Administered 2015-09-10: 0.2 mg via INTRAVENOUS

## 2015-09-10 MED ORDER — FLUORESCEIN SODIUM 10 % IJ SOLN
INTRAMUSCULAR | Status: DC | PRN
  Administered 2015-09-10: 100 mg via INTRAVENOUS

## 2015-09-10 MED ORDER — PROMETHAZINE HCL 25 MG/ML IJ SOLN: 6.2500 mg | INTRAMUSCULAR | Status: DC | PRN

## 2015-09-10 MED ORDER — FENTANYL CITRATE (PF) 250 MCG/5ML IJ SOLN
INTRAMUSCULAR | Status: AC
  Filled 2015-09-10: qty 5

## 2015-09-10 MED ORDER — LACTATED RINGERS IV SOLN
INTRAVENOUS | Status: DC | PRN
  Administered 2015-09-10 (×2): via INTRAVENOUS

## 2015-09-10 MED ORDER — STERILE WATER FOR IRRIGATION IR SOLN
Status: DC | PRN
  Administered 2015-09-10: 3000 mL

## 2015-09-10 MED ORDER — ONDANSETRON HCL 4 MG/2ML IJ SOLN
INTRAMUSCULAR | Status: DC | PRN
  Administered 2015-09-10: 4 mg via INTRAVENOUS

## 2015-09-10 SURGICAL SUPPLY — 47 items
BAG DECANTER FOR FLEXI CONT (MISCELLANEOUS) ×1 IMPLANT
BAG URINE DRAINAGE (UROLOGICAL SUPPLIES) IMPLANT
BANDAGE ELASTIC 6 VELCRO ST LF (GAUZE/BANDAGES/DRESSINGS) ×4 IMPLANT
BLADE HEX COATED 2.75 (ELECTRODE) IMPLANT
BLADE SURG 15 STRL LF DISP TIS (BLADE) ×1 IMPLANT
BLADE SURG 15 STRL SS (BLADE) ×3
BLADE SURG SZ10 CARB STEEL (BLADE) ×3 IMPLANT
CATH FOLEY 2WAY SLVR  5CC 18FR (CATHETERS) ×2
CATH FOLEY 2WAY SLVR 5CC 18FR (CATHETERS) ×1 IMPLANT
COVER SURGICAL LIGHT HANDLE (MISCELLANEOUS) ×1 IMPLANT
DECANTER SPIKE VIAL GLASS SM (MISCELLANEOUS) ×3 IMPLANT
DEVICE CAPIO SLIM BOX (INSTRUMENTS) IMPLANT
DRAIN PENROSE 18X1/2 LTX STRL (DRAIN) ×3 IMPLANT
DRAPE UTILITY 15X26 (DRAPE) ×3 IMPLANT
ELECT PENCIL ROCKER SW 15FT (MISCELLANEOUS) ×2 IMPLANT
ELECT REM PT RETURN 9FT ADLT (ELECTROSURGICAL)
ELECTRODE REM PT RTRN 9FT ADLT (ELECTROSURGICAL) IMPLANT
GLOVE BIOGEL M STRL SZ7.5 (GLOVE) ×9 IMPLANT
GOWN STRL REUS W/TWL XL LVL3 (GOWN DISPOSABLE) ×6 IMPLANT
KIT BASIN OR (CUSTOM PROCEDURE TRAY) ×3 IMPLANT
KIT SUPRAPUBIC CATH (MISCELLANEOUS) IMPLANT
LIQUID BAND (GAUZE/BANDAGES/DRESSINGS) IMPLANT
NEEDLE HYPO 22GX1.5 SAFETY (NEEDLE) ×3 IMPLANT
NS IRRIG 1000ML POUR BTL (IV SOLUTION) ×2 IMPLANT
PACK CYSTO (CUSTOM PROCEDURE TRAY) ×3 IMPLANT
PACKING VAGINAL (PACKING) IMPLANT
PAK SCROTO (SET/KITS/TRAYS/PACK) ×2 IMPLANT
PLUG CATH AND CAP STER (CATHETERS) ×3 IMPLANT
RETRACTOR LONRSTAR 16.6X16.6CM (MISCELLANEOUS) ×1 IMPLANT
RETRACTOR STAY HOOK 5MM (MISCELLANEOUS) ×3 IMPLANT
RETRACTOR STER APS 16.6X16.6CM (MISCELLANEOUS)
SCRUB PCMX 4 OZ (MISCELLANEOUS) ×1 IMPLANT
SHEET LAVH (DRAPES) ×3 IMPLANT
SLING ALTIS SYSTEM (Sling) ×2 IMPLANT
SPONGE LAP 4X18 X RAY DECT (DISPOSABLE) ×9 IMPLANT
SUCTION FRAZIER HANDLE 12FR (TUBING) ×2
SUCTION TUBE FRAZIER 12FR DISP (TUBING) ×1 IMPLANT
SUT CAPIO ETHIBPND (SUTURE) IMPLANT
SUT VIC AB 2-0 UR5 27 (SUTURE) ×3 IMPLANT
SUT VIC AB 2-0 UR6 27 (SUTURE) ×18 IMPLANT
SUT VIC AB 4-0 SH 18 (SUTURE) ×2 IMPLANT
SYRINGE 10CC LL (SYRINGE) ×3 IMPLANT
TOWEL OR NON WOVEN STRL DISP B (DISPOSABLE) ×3 IMPLANT
TUBING CONNECTING 10 (TUBING) ×2 IMPLANT
TUBING CONNECTING 10' (TUBING) ×1
WATER STERILE IRR 1500ML POUR (IV SOLUTION) ×2 IMPLANT
YANKAUER SUCT BULB TIP 10FT TU (MISCELLANEOUS) ×3 IMPLANT

## 2015-09-10 NOTE — H&P (Signed)
Reason For Visit F/u to review urodynamics   Active Problems Problems  1. Female stress incontinence (N39.3) 2. Urinary incontinence (R32)  History of Present Illness    [name] is a 45 year old female, para 2-1, 5 foot 8-1/2 inches, weighing 330 pounds. She returns today to review urodynamic results. She complains of stress urinary incontinence for 3 years, failing Kegel exercises. She is status post hysterectomy in 2009. She currently wears a poise pad, and insert, but continues to leak beyond that as well. She is status post multiple back injuries, and has now closed L5-S1 cage insert, with rod surgery. She is status post lap band surgery, and in 2015, she lost weight to 260 pounds- but has since started drinking Cokes and lap band fluid was removed because of chronic emesis, and, in association with stress of adoption of 3 children, has gained weight back. She uses no tobacco. Medications include Celexa, and Klonopin. BMI is 49.75. Physical exam shows no cystocele and no enterocele and no rectocele. She has a positive Marshall test in the supine position, with hypermobility of the urethra.    Urodynamic evaluation on 08/11/15, shows a cystometric capacity of 500 cc, with first sensation at 246 cc, normal desire at 279 cc and strong desire at 333 cc. There is no instability noted in the filling phase.    The cough leak point pressure at 100 cc was 77 cm of water, cough leak point pressure at 200 cc is 86 cm water, cough leak point pressure at 350 cc to 77 cm of water. All cough leak point pressures have mild leakage. There was no leakage during Valsalva maneuver.    Pressure flow study shows a voided volume of 4 cc with maximum flow rate of 12 cc/s. The detrusor pressure at maximum flow is 19 cm of water. Fluoroscopic examination shows that the bladder neck descends 3 cm. Spinal hardware is noted. No trabeculation or cellules or bladder diverticula were noted.    The patient has a  maximum capacity of 5 cc with positive stress urinary incontinence. There is no instability noted. She initially has some difficulty in voiding, and the laboratory setting. However she is coached to void without straining. Once she does void, she is able to avoid with a low amplitude contraction. With coughing, the patient triggers a contraction that she can avoid off of. This contraction triggers a prolonged and interrupted voiding phase. There is no reflux noted. She is able to empty efficiently with a postvoid residual of 100 cc.    This patient may do well with single incision sling, in order to avoid retropubic approach.   Past Medical History Problems  1. History of Anxiety (F41.9) 2. History of depression (Z86.59) 3. History of hypercholesterolemia (Z86.39) 4. History of hypothyroidism (Z86.39)  Surgical History Problems  1. History of Back Surgery 2. History of Hysterectomy 3. History of Neuroplasty Median Nerve At Carpal Tunnel 4. History of Nose Surgery  Current Meds 1. CeleXA TABS;  Therapy: (Recorded:15Feb2017) to Recorded 2. KlonoPIN TABS;  Therapy: (Recorded:15Feb2017) to Recorded  Allergies Medication  1. No Known Drug Allergies  Family History Problems  1. No pertinent family history  Social History Problems  1. Alcohol use (Z78.9)   1 per week 2. Caffeine use (F15.90)   minimal 3. Married 4. Never a smoker 5. Number of children   3 sons, 3 daughters 38. Occupation   Engineer, mining  Review of Systems Genitourinary, constitutional, skin, eye, otolaryngeal, hematologic/lymphatic, cardiovascular, pulmonary, endocrine, musculoskeletal, gastrointestinal, neurological  and psychiatric system(s) were reviewed and pertinent findings if present are noted and are otherwise negative.  Genitourinary: urinary urgency and incontinence.  Psychiatric: depression and anxiety.    Vitals Vital Signs [Data Includes: Last 1 Day]  Recorded: [id number] 09:37AM  Blood  Pressure: 124 / 82 Temperature: 98.2 F Heart Rate: 82  Physical Exam Constitutional: Well nourished and well developed . No acute distress.  ENT:. The ears and nose are normal in appearance.  Neck: The appearance of the neck is normal and no neck mass is present.  Pulmonary: No respiratory distress and normal respiratory rhythm and effort.  Cardiovascular: Heart rate and rhythm are normal . No peripheral edema.  Abdomen: The abdomen is obese. The abdomen is soft and nontender. No masses are palpated. No CVA tenderness. No hernias are palpable. No hepatosplenomegaly noted.  Genitourinary:. + Ruthann Cancer test.  Chaperone Present: kim lewis.  Examination of the external genitalia shows normal female external genitalia, no vulvar atrophy and no labial adhesions. The urethra is tender, but normal in appearance, does not appear stenotic and no urethral caruncle. Urethral hypermobility is present. There is no urethral discharge. No periurethral cyst is identified. Vaginal exam demonstrates tenderness and the vaginal epithelium to be poorly estrogenized, but no abnormalities, no atrophy, no discharge and no uterine prolapse. No cystocele is identified. No enterocele is identified. No rectocele is identified. There is no evidence of a vesicovaginal fistula. The cervix is is absent. The uterus is absent.  Lymphatics: The femoral and inguinal nodes are not enlarged or tender.  Skin: Normal skin turgor, no visible rash and no visible skin lesions.  Neuro/Psych:. Mood and affect are appropriate.    Assessment Assessed  1. Female stress incontinence (N39.3) 2. 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 her son had a large head, and was " stuck " in face up position in birth canal for 3 hrs with head stuck on pubic bone. She suffered a grade 4 laceration, with repeat repair after plancenta delivery. She has had SUI afterward, but worsening now. she also has had dome anal leakage,  and now has + Marshall test, and + Q-tip test, and low Valsalva LPP. She has failed Kegel exercises in the past.      We have discussed options forher. She is unable to exercise because of her SUI, and I have considered repair from above and below, and have advised her to have the Altis sling , because it is a single incision sling, placed from below, with a lesser amount of mesh, and has the ability to tension at the time of surgery. AS she loses her weight ( goal: 230lbs), she will have even better continence. She has had urodynamics, and needs pre-op physical therapy.   Plan Female stress incontinence  1. Follow-up Schedule Surgery Office  Follow-up  Status: Hold For - Appointment   Requested for: [id number] Female stress incontinence, Urinary incontinence  2. PT/OT Referral Referral  Referral: obese fiemale now pre-op for sling, but needs pelvic  floor exercise training.  Status: Hold For - Appointment,PreCert,Date of Service,Physical  Therapy  Requested for: [id number]  1. PT/ot Rx  2. Schedule op Altis sling.   Signatures Electronically signed by : Carolan Clines, M.D.; Aug 19 2015 11:07AM EST

## 2015-09-10 NOTE — Op Note (Signed)
Pre-operative diagnosis :   Stress urinary incontinence  Postoperative diagnosis:  Same  Operation:  Coloplast Altis single incision mid urethral sling  Surgeon:  S. Gaynelle Arabian, MD  First assistant:  None  Anesthesia:  GET  Preparation:  After appropriate preanesthesia, the patient was brought the operating, placed on the operating table in the dorsal supine position where general endotracheal anesthesia was introduced. This was accomplished per anesthesia because of the patient's obesity, and because of anesthesias concern for aspiration.  The patient was then replaced in the dorsal lithotomy position. Because of greatly enlarged adipose tissue of the thighs, the thighs were wrapped with 5 inch Ace wrap bilaterally. The pubis was then prepped with Betadine solution, and draped in usual fashion. The arm band was double checked. The history was reviewed.  Review history:   [name] is a 45 year old female, para 2-1, 5 foot 8-1/2 inches, weighing 330 pounds. She returns today to review urodynamic results. She complains of stress urinary incontinence for 3 years, failing Kegel exercises. She is status post hysterectomy in 2009. She currently wears a poise pad, and insert, but continues to leak beyond that as well. She is status post multiple back injuries, and has now closed L5-S1 cage insert, with rod surgery. She is status post lap band surgery, and in 2015, she lost weight to 260 pounds- but has since started drinking Cokes and lap band fluid was removed because of chronic emesis, and, in association with stress of adoption of 3 children, has gained weight back. She uses no tobacco. Medications include Celexa, and Klonopin. BMI is 49.75. Physical exam shows no cystocele and no enterocele and no rectocele. She has a positive Marshall test in the supine position, with hypermobility of the urethra.  Statement of  Likelihood of Success: Excellent. TIME-OUT observed.:  Procedure:  The self-retaining  retractor was placed in standard fashion. The labia were retracted and Foley catheter was placed. The long spoon build speculum was placed, protected by a 4 x 18 sponge. Foley catheter, 72 French, with 10 mL of water in the balloon outlined the urethra. The vaginal tissue was very thin. A blue marking pen outlined the Foley balloon, and the mid urethra. The mid urethra was marked for 2 cm. 10 mL of Marcaine 0.5 percent with epinephrine 1 200,000 were then injected in the periurethral space. Following this, a 2 cm incision was made over the mid urethra and subcutaneous tissue dissected with the Strully scissors. Dissection was accomplished under the pubis, with both the scissors, and finger dissection.  The Altis sling was then opened, and the tensioning suture was manipulated. The sling was then placed in standard fashion, first on the right side, underneath the pubis, into the operator internist, and the left side. The sling was smooth and flat against the urethra, with right angle clamp easily against the urethra without tension. Tension was accomplished after the bladder was filled with sterile saline. Cystoscopy was accomplished, after floor see dye was given, showing greenish yellow urine from both ureteral orifices. The bladder was left full so that the patient would be able to void in the recovery room.  The wound was irrigated copiously with antibody irrigation, and closed with running 2-0 Vicryl suture. Several areas within the vagina were noted to have denudement from the retractor, and to the areas required a single suture with a 4-0 Vicryl suture.  No bleeding was noted, and no packing or catheter was necessary. The patient received IV Toradol, and was awakened, and taken  to recovery room in good condition. She will void prior to discharge. Recovery room as notified of the patient's full bladder.

## 2015-09-10 NOTE — Progress Notes (Signed)
Pt tolerating liquids, able to void after 3 attempts.  Spoke with MD, called prescription in for pt---reports yeast infection.  Pt denies pain, belongings packed, peri bottle provided, d/c home with spouse.

## 2015-09-10 NOTE — Anesthesia Procedure Notes (Signed)
Procedure Name: Intubation Date/Time: 09/10/2015 10:46 AM Performed by: Aylan Bayona, Virgel Gess Pre-anesthesia Checklist: Patient identified, Emergency Drugs available, Suction available, Patient being monitored and Timeout performed Patient Re-evaluated:Patient Re-evaluated prior to inductionOxygen Delivery Method: Circle system utilized Preoxygenation: Pre-oxygenation with 100% oxygen Intubation Type: IV induction Ventilation: Mask ventilation without difficulty Laryngoscope Size: Mac and 4 Grade View: Grade II Tube type: Oral Tube size: 7.5 mm Number of attempts: 1 Airway Equipment and Method: Stylet Placement Confirmation: ETT inserted through vocal cords under direct vision,  positive ETCO2,  CO2 detector and breath sounds checked- equal and bilateral Secured at: 22 cm Tube secured with: Tape Dental Injury: Teeth and Oropharynx as per pre-operative assessment

## 2015-09-10 NOTE — Progress Notes (Signed)
Pt attempted to void, sat on toilet x10 mins.  Peri bottle provided, unable to urinate. Assisted back to bed, bladder scan performed, 30cc urine shown during scan.

## 2015-09-10 NOTE — Anesthesia Postprocedure Evaluation (Signed)
Anesthesia Post Note  Patient: [name]  Procedure(s) Performed: Procedure(s) (LRB): PUBO-VAGINAL SLING, CYSTOSCOPY WITH FLUORESCEIN (N/A)  Patient location during evaluation: PACU Anesthesia Type: General Level of consciousness: awake and alert Pain management: pain level controlled Vital Signs Assessment: post-procedure vital signs reviewed and stable Respiratory status: spontaneous breathing, nonlabored ventilation, respiratory function stable and patient connected to nasal cannula oxygen Cardiovascular status: blood pressure returned to baseline and stable Postop Assessment: no signs of nausea or vomiting Anesthetic complications: no    Last Vitals:  Filed Vitals:   09/10/15 1315 09/10/15 1455  BP: 122/76 131/76  Pulse: 89 82  Temp: 36.6 C 36.3 C  Resp: 12 18    Last Pain:  Filed Vitals:   09/10/15 1456  PainSc: 0-No pain                 Catalina Gravel

## 2015-09-10 NOTE — Discharge Instructions (Addendum)
Anterior or Posterior Colporrhaphy, Sling Procedure Care After Refer to this sheet in the next few weeks. These instructions provide you with information on caring for yourself after your procedure. Your caregiver may also give you specific instructions. Your treatment has been planned according to current medical practices, but problems sometimes occur. Call your caregiver if you have any problems or questions after your procedure. HOME CARE INSTRUCTIONS  Rest as much as possible the first 2 weeks.   Avoid heavy lifting (more than 10 pounds or 4.5 kg), pushing, or pulling. Limit stair climbing to once or twice a day the first week, then slowly increase this activity.   Avoid standing for prolonged periods of time.   Talk with your caregiver about when you may resume your usual physical activity.   You may resume your normal diet.   Limit fluids to 6 to 8 glasses of non-caffeinated beverages per day.   Eat a well-balanced diet. Daily portions of food from the meat (protein), milk, fruit, vegetable, and bread families are necessary for your health.   Your normal bowel function should return. If constipation should occur, you may:   Take a mild laxative.   Add fruit and bran to your diet.   Drink more liquids.   If additional bladder care is required, you will receive instructions from your caregiver. Call your caregiver if you experience burning, urgency, or frequency on urination or if you are unable to completely empty your bladder.   You may take a shower and wash your hair.   Only take over-the-counter or prescription medicines for pain, fever, or discomfort as directed by your caregiver.   Clean the incision with water. Do not use a dressing unless the incision is draining or irritated. Check your incision daily for redness, draining, swelling, or separation of the skin. Call your caregiver if you notice any of these.   Keep your perineal area (the area between vagina and  rectum) clean and dry. Perform perineal care after every bowel movement and each time you urinate. You may take a sitz bath or sit in a tub of clean, warm water when necessary, unless your caregiver tells you otherwise.   Do not have sexual intercourse until permitted by your caregiver.   It is still important to have a yearly pelvic exam.  SEEK MEDICAL CARE IF:  You have shaking chills.   You have an oral temperature above 102 F (38.9 C).   Your pain is not relieved or becomes severe.  MAKE SURE YOU:   Understand these instructions.   Will watch your condition.   Will get help right away if you are not doing well or get worse.  Document Released: 01/19/2004 Document Revised: 05/26/2011 Document Reviewed: 10/10/2007 [facility] Patient Information 2012 [place].  General Anesthesia, Adult, Care After Refer to this sheet in the next few weeks. These instructions provide you with information on caring for yourself after your procedure. Your health care provider may also give you more specific instructions. Your treatment has been planned according to current medical practices, but problems sometimes occur. Call your health care provider if you have any problems or questions after your procedure. WHAT TO EXPECT AFTER THE PROCEDURE After the procedure, it is typical to experience:  Sleepiness.  Nausea and vomiting. HOME CARE INSTRUCTIONS  For the first 24 hours after general anesthesia:  Have a responsible person with you.  Do not drive a car. If you are alone, do not take public transportation.  Do  not drink alcohol.  Do not take medicine that has not been prescribed by your health care provider.  Do not sign important papers or make important decisions.  You may resume a normal diet and activities as directed by your health care provider.  Change bandages (dressings) as directed.  If you have questions or problems that seem related to general anesthesia, call the  hospital and ask for the anesthetist or anesthesiologist on call. SEEK MEDICAL CARE IF:  You have nausea and vomiting that continue the day after anesthesia.  You develop a rash. SEEK IMMEDIATE MEDICAL CARE IF:   You have difficulty breathing.  You have chest pain.  You have any allergic problems.   This information is not intended to replace advice given to you by your health care provider. Make sure you discuss any questions you have with your health care provider.   Document Released: 09/12/2000 Document Revised: 06/27/2014 Document Reviewed: 10/05/2011 Elsevier Interactive Patient Education [employer].

## 2015-09-10 NOTE — Interval H&P Note (Signed)
History and Physical Interval Note:  09/10/2015 9:58 AM  [name]  has presented today for surgery, with the diagnosis of STRESS INCONTINENCE  The various methods of treatment have been discussed with the patient and family. After consideration of risks, benefits and other options for treatment, the patient has consented to  Procedure(s): PUBO-VAGINAL SLING (N/A) as a surgical intervention .  The patient's history has been reviewed, patient examined, no change in status, stable for surgery.  I have reviewed the patient's chart and labs.  Questions were answered to the patient's satisfaction.     [name] I Leticia Mcdiarmid

## 2015-09-10 NOTE — Progress Notes (Signed)
Pt attempted to void, unable to produce any urine.  Bladder scan done, 59cc urine showing on scan.  Tolerating liquids, coca-cola and water.  MD paged, awaiting call.

## 2015-09-10 NOTE — Anesthesia Preprocedure Evaluation (Addendum)
Anesthesia Evaluation  Patient identified by MRN, date of birth, ID band Patient awake    Reviewed: Allergy & Precautions, NPO status , Patient's Chart, lab work & pertinent test results  Airway Mallampati: III  TM Distance: >3 FB Neck ROM: Full    Dental  (+) Teeth Intact, Dental Advisory Given   Pulmonary neg pulmonary ROS,    Pulmonary exam normal breath sounds clear to auscultation       Cardiovascular Normal cardiovascular exam+ dysrhythmias ("one time related to stress" per patient)  Rhythm:Regular Rate:Normal     Neuro/Psych  Headaches, PSYCHIATRIC DISORDERS Anxiety Depression    GI/Hepatic Neg liver ROS, S/p lap banding    Endo/Other  Morbid obesity  Renal/GU negative Renal ROS   Stress urinary incontinence    Musculoskeletal Multiple back surgeries    Abdominal   Peds  Hematology negative hematology ROS (+) 13.5/42.6   Anesthesia Other Findings Day of surgery medications reviewed with the patient.  Reproductive/Obstetrics negative OB ROS                           Anesthesia Physical Anesthesia Plan  ASA: III  Anesthesia Plan: General   Post-op Pain Management:    Induction: Intravenous  Airway Management Planned: Oral ETT  Additional Equipment:   Intra-op Plan:   Post-operative Plan: Extubation in OR  Informed Consent: I have reviewed the patients History and Physical, chart, labs and discussed the procedure including the risks, benefits and alternatives for the proposed anesthesia with the patient or authorized representative who has indicated his/her understanding and acceptance.   Dental advisory given  Plan Discussed with: CRNA  Anesthesia Plan Comments: (Risks/benefits of general anesthesia discussed with patient including risk of damage to teeth, lips, gum, and tongue, nausea/vomiting, allergic reactions to medications, and the possibility of heart attack,  stroke and death.  All patient questions answered.  Patient wishes to proceed.)        Anesthesia Quick Evaluation

## 2015-09-10 NOTE — Transfer of Care (Signed)
Immediate Anesthesia Transfer of Care Note  Patient: [name]  Procedure(s) Performed: Procedure(s): PUBO-VAGINAL SLING, CYSTOSCOPY WITH FLUORESCEIN (N/A)  Patient Location: PACU  Anesthesia Type:General  Level of Consciousness:  sedated, patient cooperative and responds to stimulation  Airway & Oxygen Therapy:Patient Spontanous Breathing and Patient connected to face mask oxgen  Post-op Assessment:  Report given to PACU RN and Post -op Vital signs reviewed and stable  Post vital signs:  Reviewed and stable  Last Vitals:  Filed Vitals:   09/10/15 0858 09/10/15 1218  BP: 142/96 131/75  Pulse: 96 101  Temp: 36.6 C 36.6 C  Resp: 18 7    Complications: No apparent anesthesia complications

## 2015-11-03 ENCOUNTER — Other Ambulatory Visit: Payer: Self-pay | Admitting: Sports Medicine

## 2015-11-03 DIAGNOSIS — M25561 Pain in right knee: Secondary | ICD-10-CM

## 2015-11-09 ENCOUNTER — Inpatient Hospital Stay: Admission: RE | Admit: 2015-11-09 | Payer: BLUE CROSS/BLUE SHIELD | Source: Ambulatory Visit

## 2015-11-14 ENCOUNTER — Ambulatory Visit
Admission: RE | Admit: 2015-11-14 | Discharge: 2015-11-14 | Disposition: A | Discharge status: Home / Self Care | Payer: BLUE CROSS/BLUE SHIELD | Source: Ambulatory Visit | Attending: Sports Medicine | Admitting: Sports Medicine

## 2015-11-14 DIAGNOSIS — M25561 Pain in right knee: Secondary | ICD-10-CM

## 2016-03-31 ENCOUNTER — Other Ambulatory Visit: Payer: Self-pay | Admitting: Gynecology

## 2016-03-31 DIAGNOSIS — Z1231 Encounter for screening mammogram for malignant neoplasm of breast: Secondary | ICD-10-CM

## 2016-04-05 ENCOUNTER — Ambulatory Visit (INDEPENDENT_AMBULATORY_CARE_PROVIDER_SITE_OTHER): Payer: BLUE CROSS/BLUE SHIELD | Admitting: Gynecology

## 2016-04-05 ENCOUNTER — Encounter: Payer: Self-pay | Admitting: Gynecology

## 2016-04-05 VITALS — BP 120/78

## 2016-04-05 DIAGNOSIS — N644 Mastodynia: Secondary | ICD-10-CM | POA: Diagnosis not present

## 2016-04-05 NOTE — Progress Notes (Signed)
    ESPEN MIDGLEY [birth year] [id number]        45 y.o.  G2P1011 presents complaining of 1-2 months of progressively worsening right breast pain. Aching to stabbing. Worsens when leans forward. Limited to the tail of Spence region. No masses on self-exam. No nipple discharge. Last mammogram 2015.  Past medical history,surgical history, problem list, medications, allergies, family history and social history were all reviewed and documented in the EPIC chart.  Directed ROS with pertinent positives and negatives documented in the history of present illness/assessment and plan.  Exam: Caryn Bee assistant Vitals:   04/05/16 1319  BP: 120/78   General appearance:  Normal Both breast examined lying and sitting without masses, retractions, discharge, adenopathy. Region of discomfort patient is pointing to is in the tail of Spence area of the right breast.  Assessment/Plan:  45 y.o. G2P1011 right talus Spence mastalgia over the last 1-2 months progressively worse. Exam is normal to both the patient and physician. Will start with diagnostic mammography and ultrasound. Patient knows to call my office if she does not hear from the breast center to arrange this over the next week or so. Various possibilities to include physiologic, nonpalpable cystic changes up to including malignancy were all reviewed.  Greater than 50% of my time was spent in direct face to face counseling and coordination of care with the patient.     Anastasio Auerbach MD, 1:48 PM 04/05/2016

## 2016-04-05 NOTE — Patient Instructions (Signed)
The breast center should call you to arrange for the diagnostic mammography and ultrasound. Call my office if you do not hear from them within 1 week.

## 2016-04-06 ENCOUNTER — Telehealth: Payer: Self-pay | Admitting: *Deleted

## 2016-04-06 DIAGNOSIS — N644 Mastodynia: Secondary | ICD-10-CM

## 2016-04-06 NOTE — Telephone Encounter (Signed)
-----   Message from Anastasio Auerbach, MD sent at 04/05/2016  1:50 PM EDT ----- Arrange diagnostic mammography and right breast ultrasound reference progressively worsening right tail of Spence pain over the last 1-2 months.

## 2016-04-06 NOTE — Telephone Encounter (Signed)
Appointment 04/11/16 @ 1:40pm at breast center pt informed.

## 2016-04-11 ENCOUNTER — Ambulatory Visit
Admission: RE | Admit: 2016-04-11 | Discharge: 2016-04-11 | Disposition: A | Discharge status: Home / Self Care | Payer: BLUE CROSS/BLUE SHIELD | Source: Ambulatory Visit | Attending: Gynecology | Admitting: Gynecology

## 2016-04-11 DIAGNOSIS — N644 Mastodynia: Secondary | ICD-10-CM

## 2016-04-20 ENCOUNTER — Other Ambulatory Visit: Payer: Self-pay | Admitting: Orthopedic Surgery

## 2016-04-20 DIAGNOSIS — M25561 Pain in right knee: Secondary | ICD-10-CM

## 2016-05-01 ENCOUNTER — Ambulatory Visit
Admission: RE | Admit: 2016-05-01 | Discharge: 2016-05-01 | Disposition: A | Discharge status: Home / Self Care | Payer: BLUE CROSS/BLUE SHIELD | Source: Ambulatory Visit | Attending: Orthopedic Surgery | Admitting: Orthopedic Surgery

## 2016-05-01 DIAGNOSIS — M25561 Pain in right knee: Secondary | ICD-10-CM

## 2016-12-28 ENCOUNTER — Encounter (HOSPITAL_COMMUNITY): Payer: Self-pay

## 2017-08-09 DIAGNOSIS — Z0289 Encounter for other administrative examinations: Secondary | ICD-10-CM

## 2019-03-12 ENCOUNTER — Encounter: Payer: Self-pay | Admitting: Gynecology

## 2019-05-27 ENCOUNTER — Other Ambulatory Visit: Payer: Self-pay | Admitting: Internal Medicine

## 2019-05-27 DIAGNOSIS — Z1231 Encounter for screening mammogram for malignant neoplasm of breast: Secondary | ICD-10-CM

## 2019-07-11 ENCOUNTER — Inpatient Hospital Stay: Admission: RE | Admit: 2019-07-11 | Payer: BLUE CROSS/BLUE SHIELD | Source: Ambulatory Visit

## 2019-07-22 ENCOUNTER — Other Ambulatory Visit: Payer: Self-pay

## 2019-07-22 ENCOUNTER — Ambulatory Visit
Admission: RE | Admit: 2019-07-22 | Discharge: 2019-07-22 | Disposition: A | Discharge status: Home / Self Care | Payer: BC Managed Care – PPO | Source: Ambulatory Visit | Attending: Internal Medicine | Admitting: Internal Medicine

## 2019-07-22 DIAGNOSIS — Z1231 Encounter for screening mammogram for malignant neoplasm of breast: Secondary | ICD-10-CM

## 2021-09-06 IMAGING — MG DIGITAL SCREENING BILAT W/ TOMO W/ CAD
8 of 14 series · 8 of 40 positions shown · non-contrast
Comparison: Previous exam(s).

CLINICAL DATA: Screening.

EXAM:
DIGITAL SCREENING BILATERAL MAMMOGRAM WITH TOMO AND CAD

[R CC synth-2D]
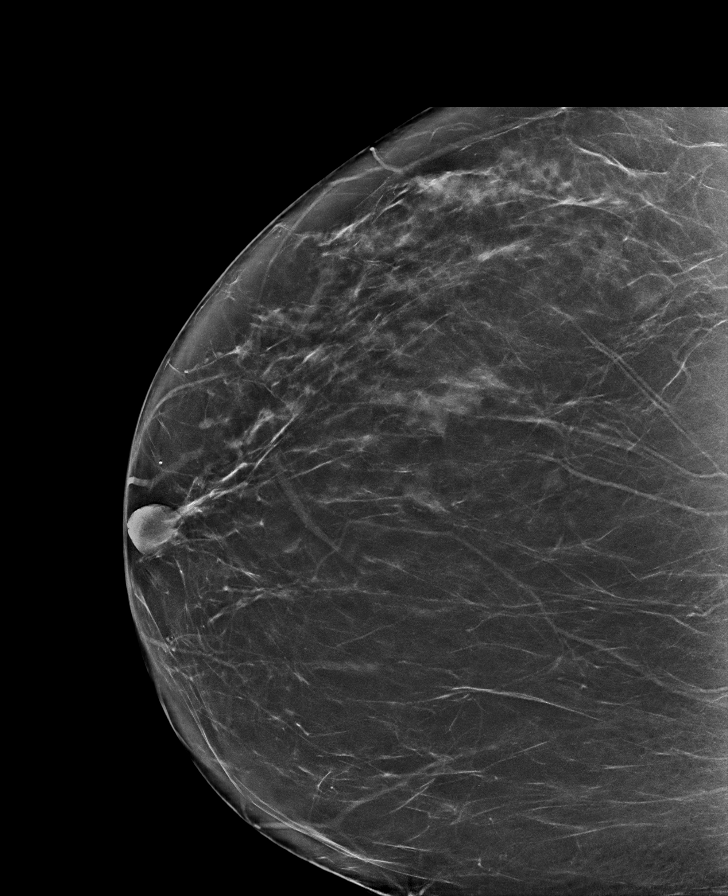

[L MLO synth-2D (1 of 2)]
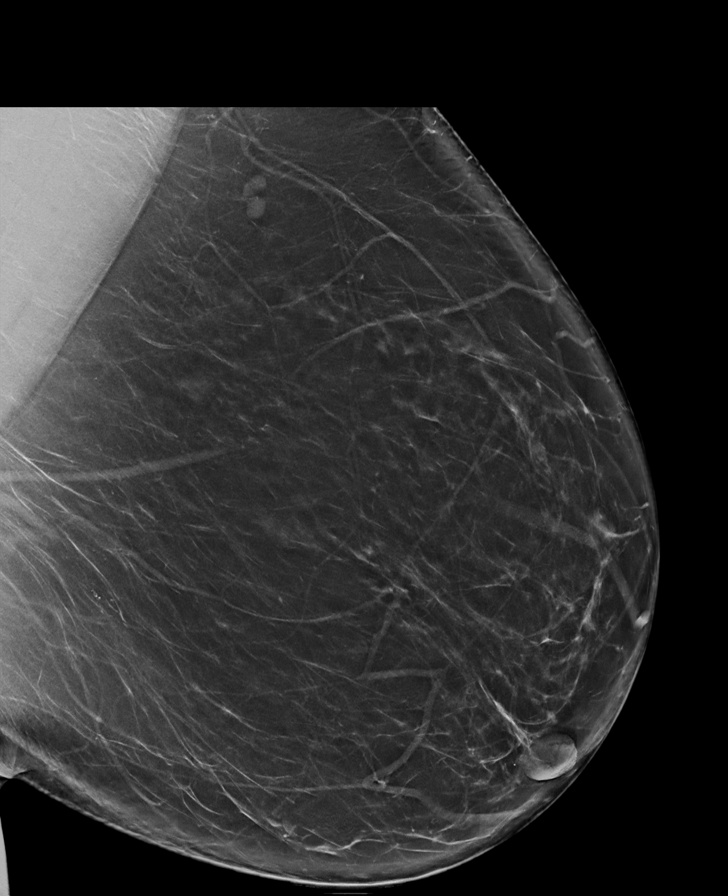

[R MLO synth-2D (1 of 2)]
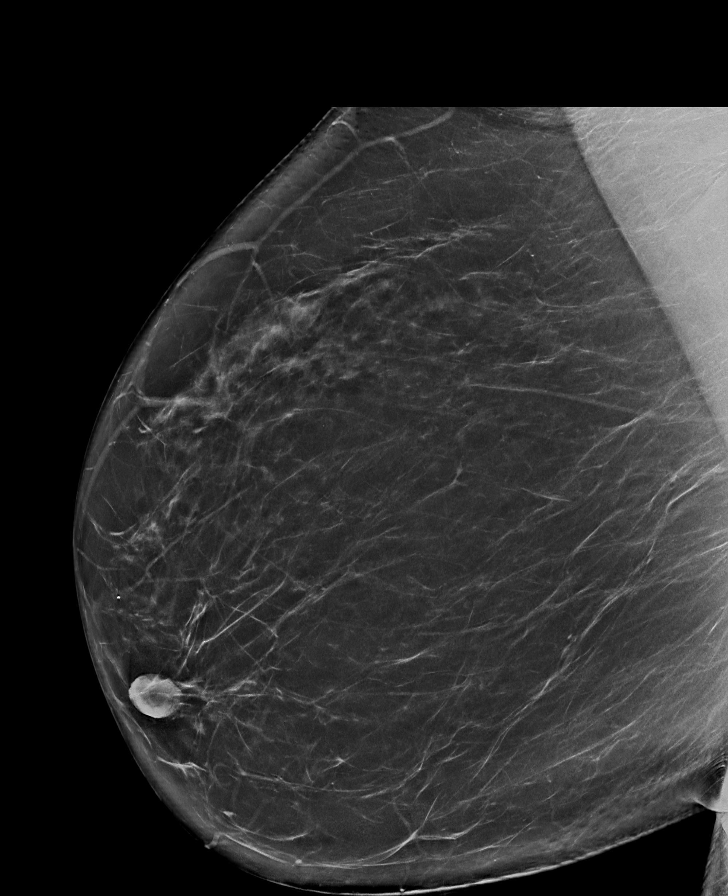

[L MLO synth-2D (2 of 2)]
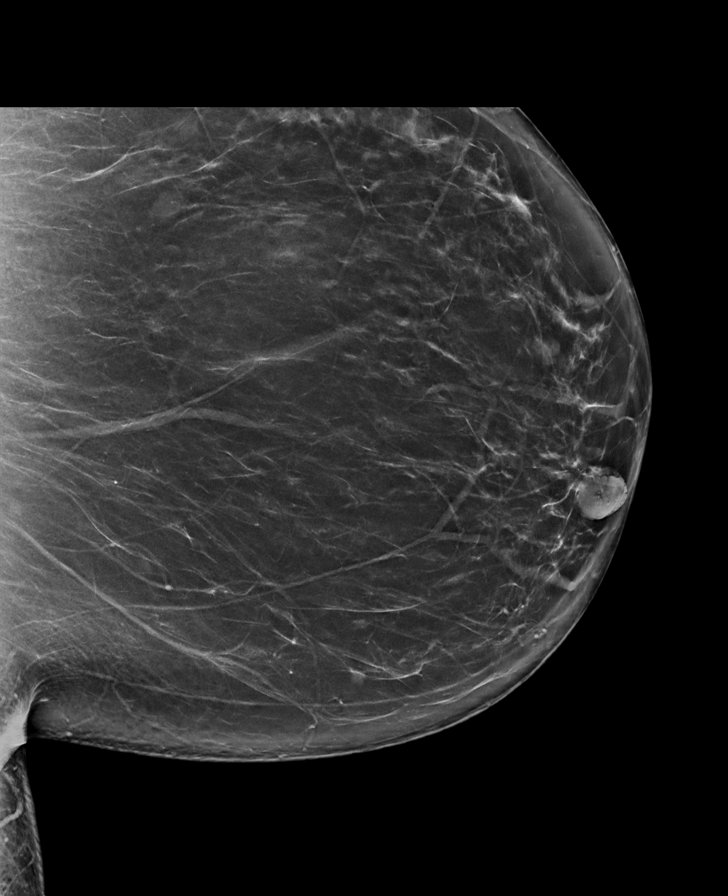

[R MLO synth-2D (2 of 2)]
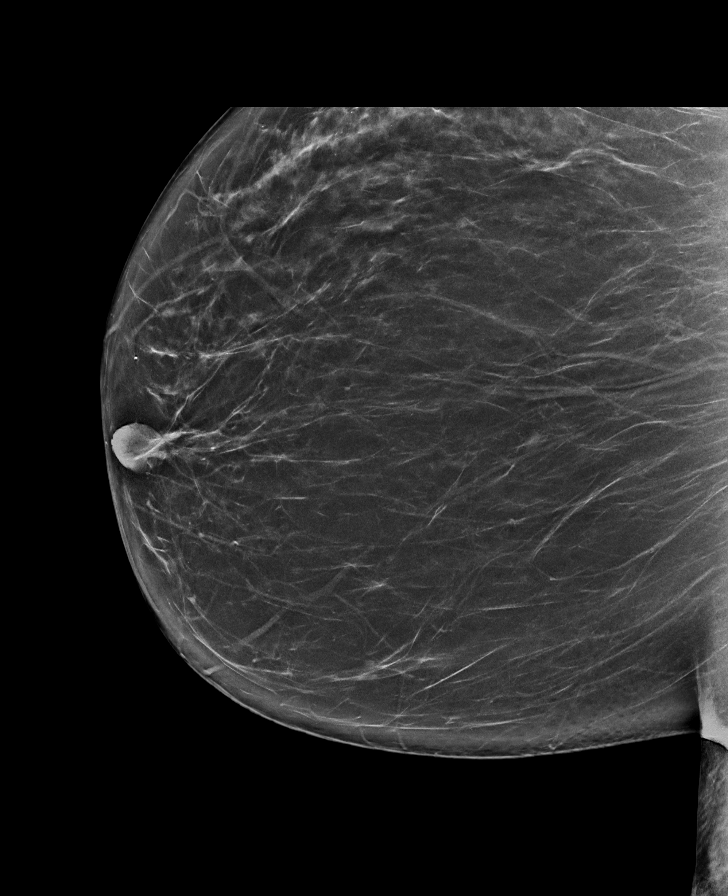

[L CC synth-2D]
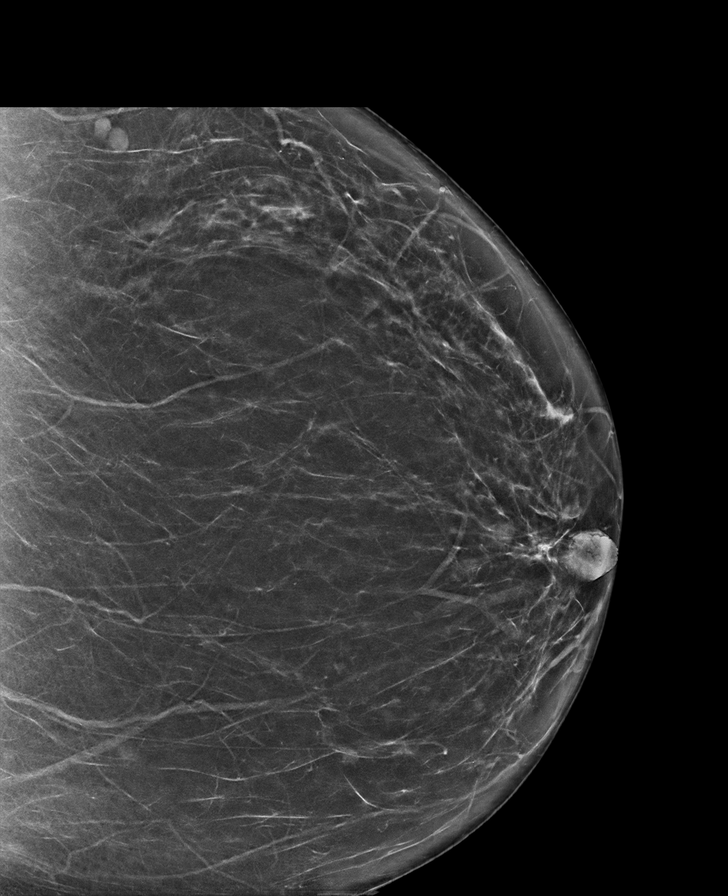

[L CV synth-2D]
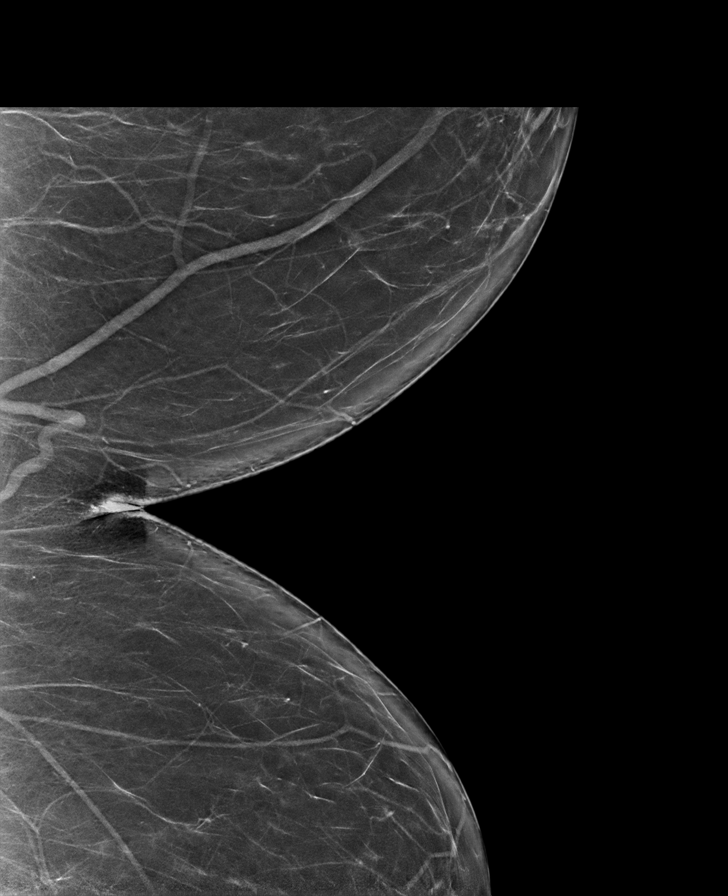

[L MLO tomo · tomo slice 52/103.0]
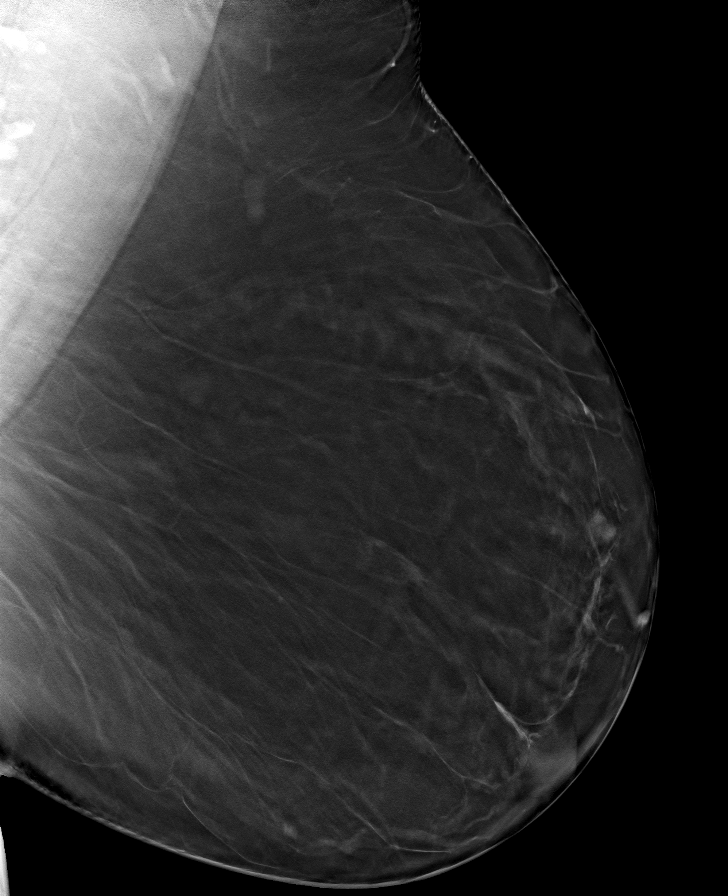

[8 of 40 positions shown; findings below may reference images not displayed]

ACR Breast Density Category b: There are scattered areas of
fibroglandular density.
FINDINGS: There are no findings suspicious for malignancy. Images were
processed with CAD.
IMPRESSION: No mammographic evidence of malignancy. A result letter of this
screening mammogram will be mailed directly to the patient.

RECOMMENDATION:
Screening mammogram in one year. (Code:[id number])

BI-RADS CATEGORY  1: Negative.
